# Patient Record
Sex: Male | Born: 1970 | Race: White | Hispanic: No | Marital: Single
Health system: Midwestern US, Community
[De-identification: ages and names within clinical notes are randomized; demographics above are authoritative.]

## PROBLEM LIST (undated history)

## (undated) DIAGNOSIS — F209 Schizophrenia, unspecified: Secondary | ICD-10-CM

## (undated) HISTORY — PX: NO PAST SURGERIES: SHX2092

---

## 2004-02-14 ENCOUNTER — Emergency Department (HOSPITAL_COMMUNITY): Admission: EM | Admit: 2004-02-14 | Discharge: 2004-02-15 | Payer: Self-pay | Admitting: Emergency Medicine

## 2005-10-07 ENCOUNTER — Emergency Department (HOSPITAL_COMMUNITY): Admission: EM | Admit: 2005-10-07 | Discharge: 2005-10-07 | Payer: Self-pay | Admitting: Emergency Medicine

## 2008-05-23 ENCOUNTER — Ambulatory Visit: Payer: Self-pay | Admitting: Internal Medicine

## 2008-05-23 ENCOUNTER — Inpatient Hospital Stay (HOSPITAL_COMMUNITY): Admission: EM | Admit: 2008-05-23 | Discharge: 2008-05-25 | Payer: Self-pay | Admitting: Emergency Medicine

## 2008-06-10 ENCOUNTER — Encounter: Payer: Self-pay | Admitting: Internal Medicine

## 2008-06-10 ENCOUNTER — Ambulatory Visit: Payer: Self-pay | Admitting: Infectious Diseases

## 2008-06-10 DIAGNOSIS — J069 Acute upper respiratory infection, unspecified: Secondary | ICD-10-CM | POA: Insufficient documentation

## 2008-06-10 LAB — CONVERTED CEMR LAB
Lymphocytes Relative: 37 % (ref 12–46)
Lymphs Abs: 2.1 10*3/uL (ref 0.7–4.0)
MCV: 90.9 fL (ref 78.0–100.0)
Monocytes Relative: 9 % (ref 3–12)
Neutrophils Relative %: 49 % (ref 43–77)
RBC: 4.91 M/uL (ref 4.22–5.81)
RDW: 12.5 % (ref 11.5–15.5)

## 2009-06-10 IMAGING — CR DG CHEST 2V
2 series · 2 of 2 positions shown · non-contrast
Comparison: 05/22/2008

CLINICAL DATA: Asthma

CHEST - 2 VIEW

[w chest pa]
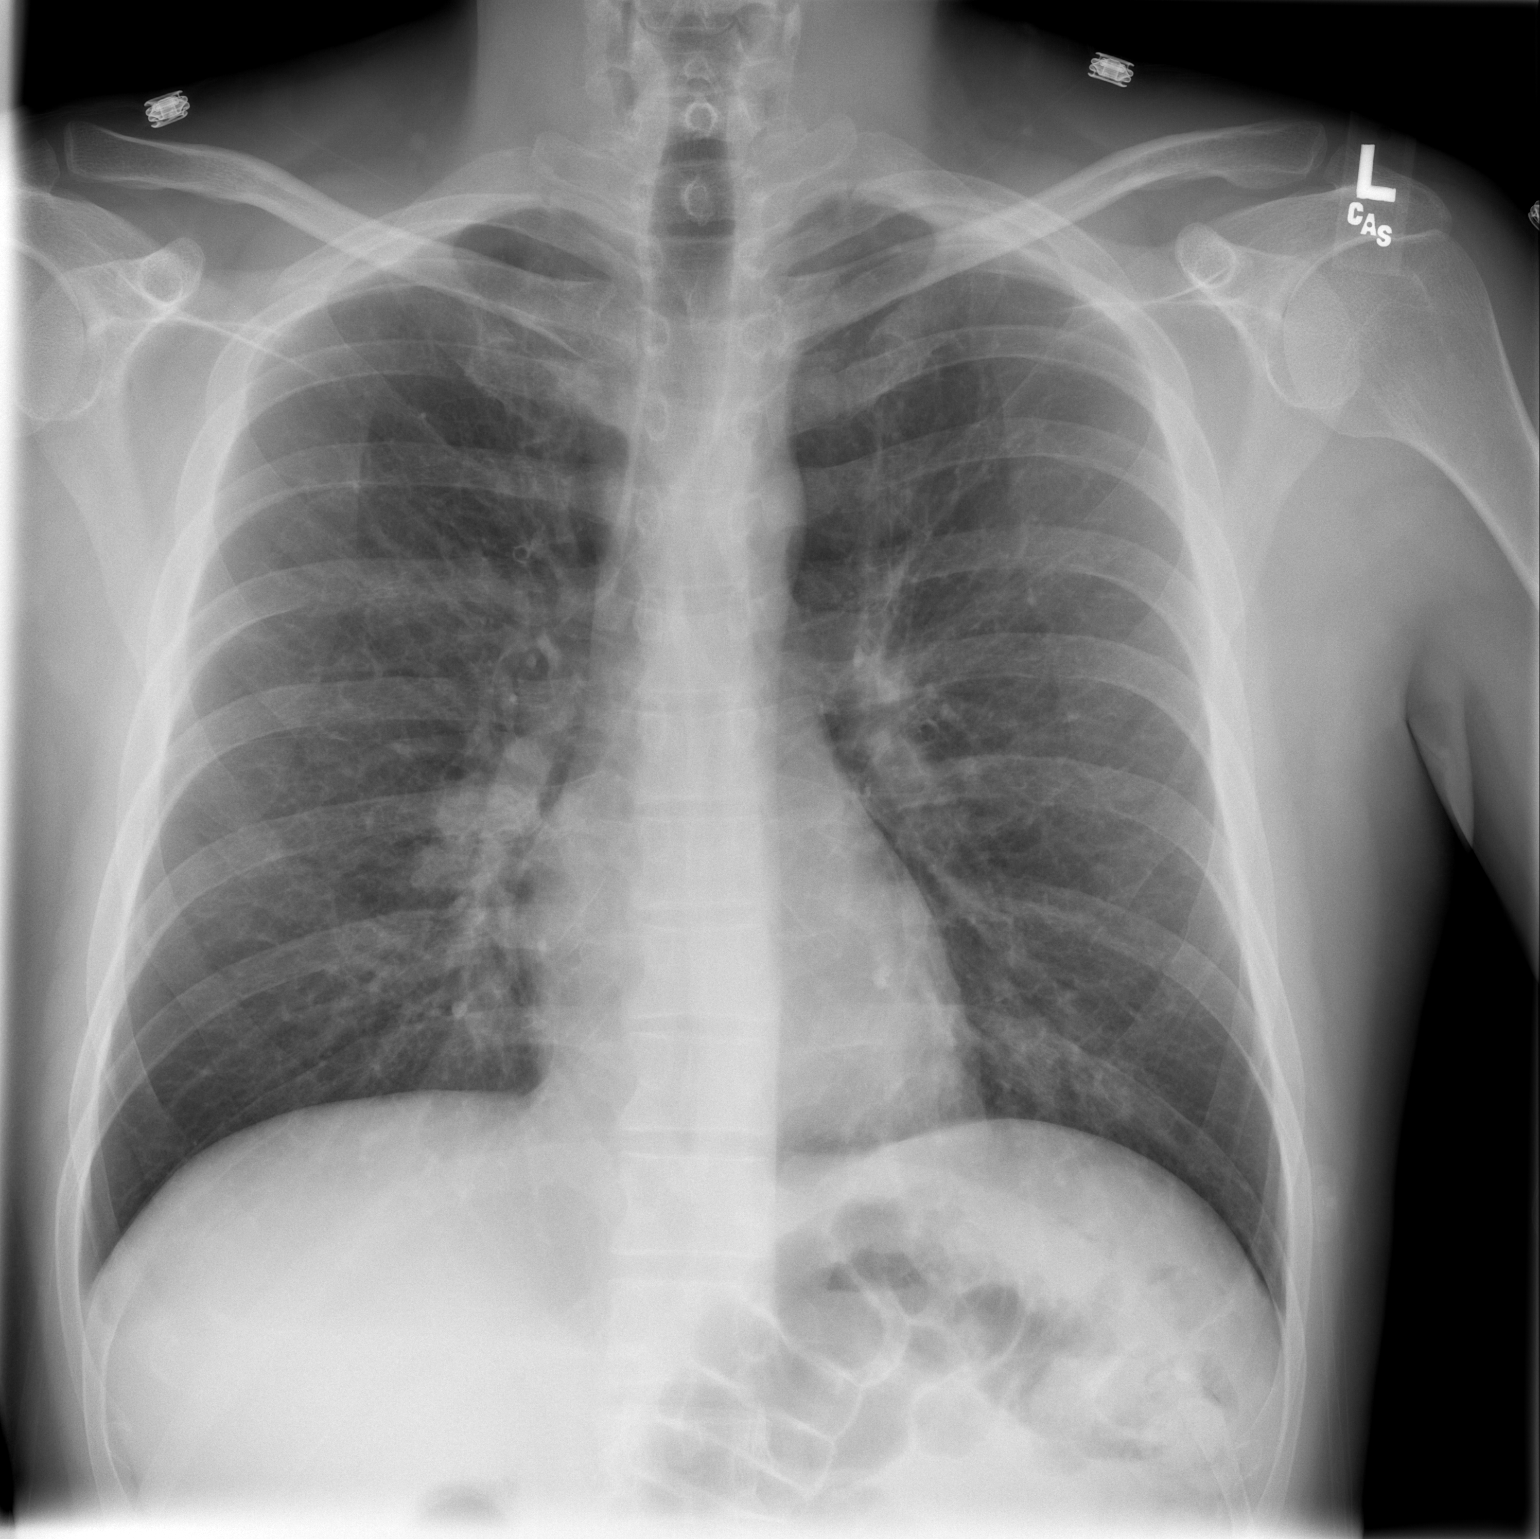

[w chest lat]
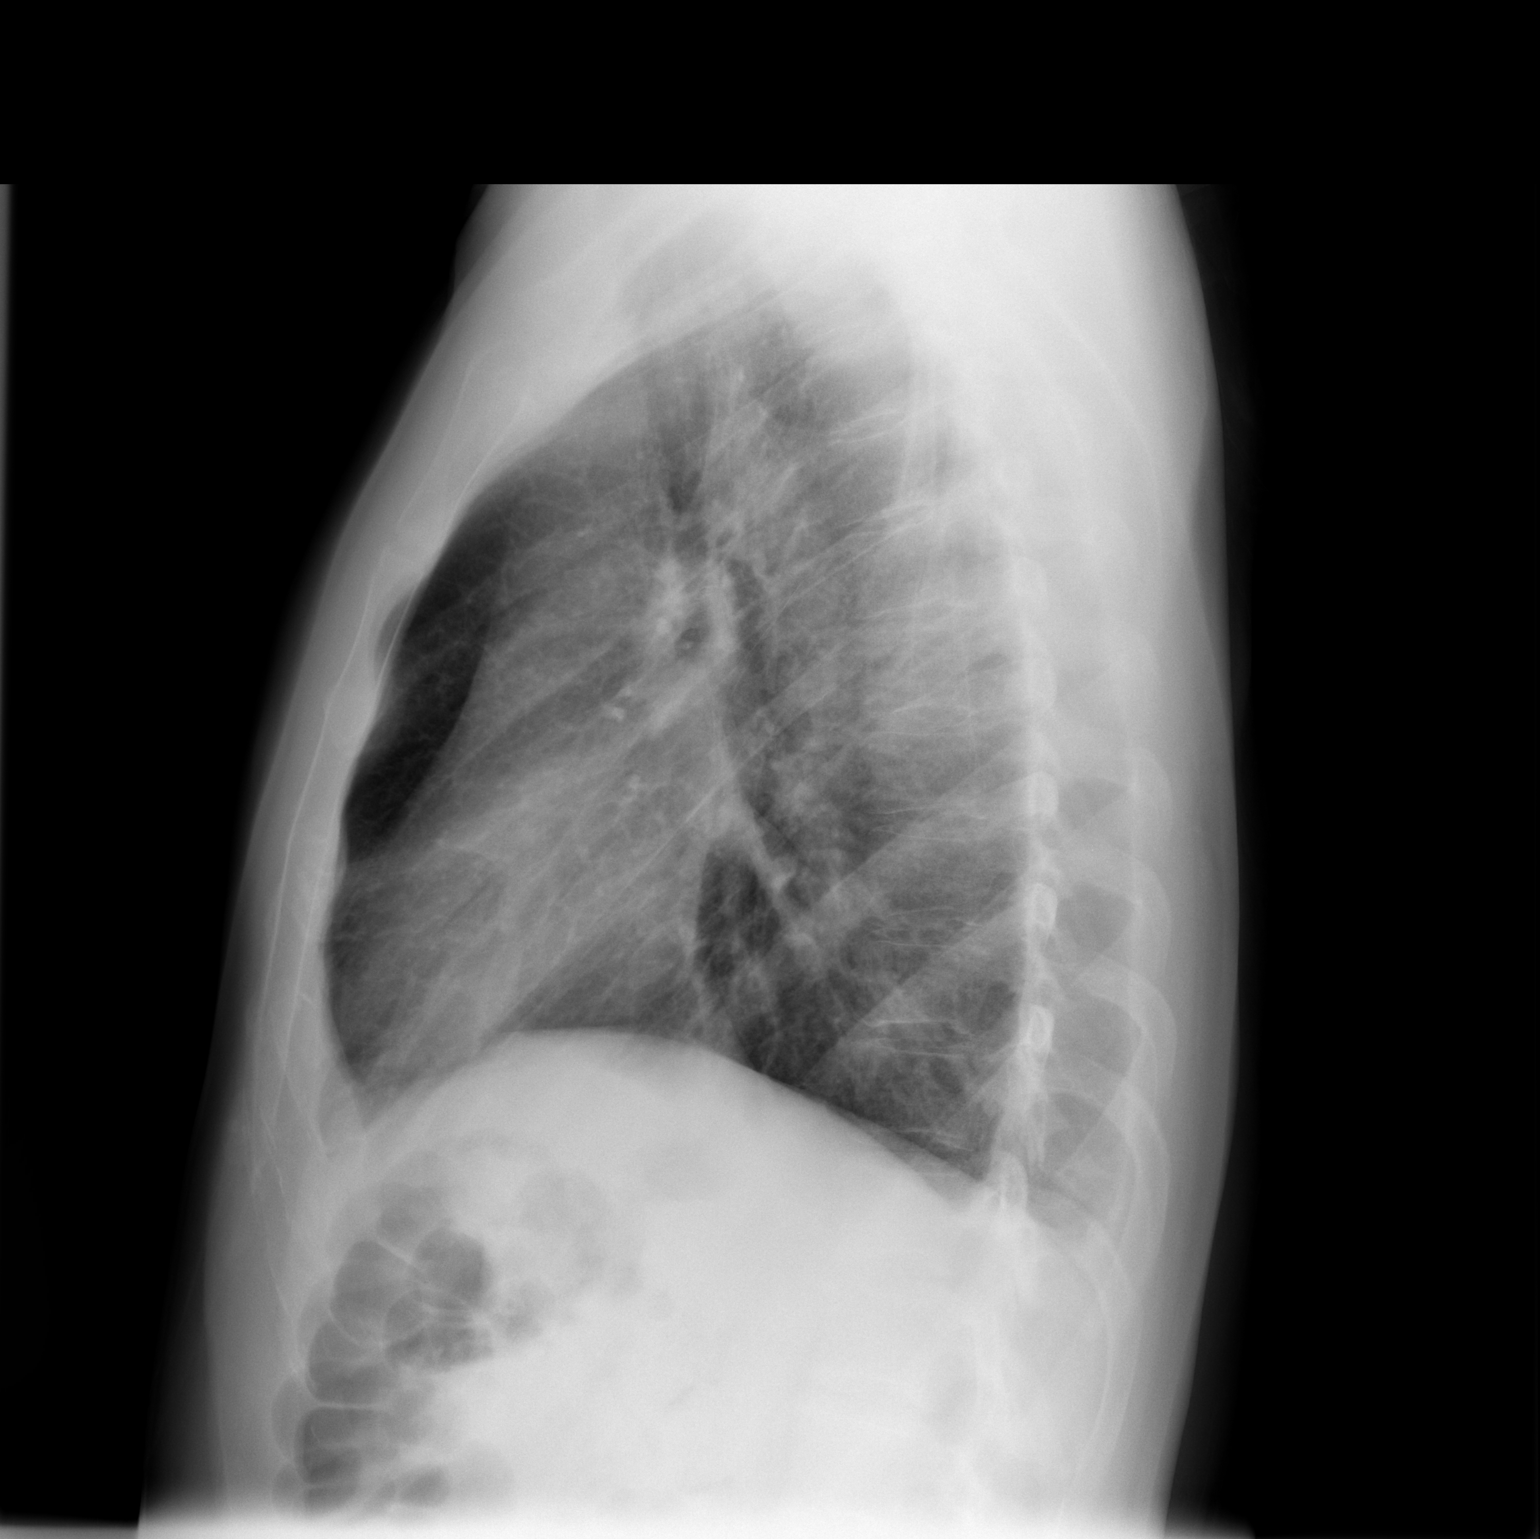

[2 of 2 positions shown; findings below may reference images not displayed]

FINDINGS: Irregular pulmonary opacities noted on the CT scan of the
chest are difficult to define.  Hazy opacities in the right upper
lobe and left base are noted.  No pneumothorax.  Small effusions
are suspected.
IMPRESSION: Subtle right upper lobe and left basilar pulmonary opacities are
noted.

## 2010-11-29 NOTE — Discharge Summary (Signed)
Jerry Frye, Jerry Frye              ACCOUNT NO.:  [id number]   MEDICAL RECORD NO.:  [id number]          PATIENT TYPE:  INP   LOCATION:  5148                         FACILITY:  [facility]   PHYSICIAN:  Alvester Morin, M.D.  DATE OF BIRTH:  Feb 28, 1971   DATE OF ADMISSION:  05/22/2008  DATE OF DISCHARGE:  05/25/2008                               DISCHARGE SUMMARY   DISCHARGE DIAGNOSES:  1. Hypoxia, cough, and shortness of breath secondary to upper      respiratory infection.  2. Bipolar disorder.  3. Tobacco abuse.   DISCHARGE MEDICATIONS:  1. Doxycycline 100 mg 1 tablet by mouth twice a day for 8 days.  2. Prednisone 30 mg 1 tablet by mouth on date May 26, 2008,      prednisone 20 mg 1 tablet by mouth on date May 27, 2008,      prednisone 10 mg 1 tablet by mouth for 1 day, on May 28, 2008,      prednisone 5 mg 1 tablet by mouth for 1 day on May 29, 2008.   The patient is instructed to take multivitamin 1 tablet by mouth daily  and also to use Mucinex 600 mg 1 tablet by mouth twice a day for 10 days  for cough control and expectoration.   The patient was instructed to drink plenty of fluid and use Tylenol for  fever, pain, and comfort, 650 mg q.6 h. p.r.n.   DISPOSITION AND FOLLOWUP:  The patient had been discharged in a stable  condition with hospital followup on June 10, 2008, at 2:30 p.m. by  Dr. Aldine Contes at the Outpatient Clinic, [facility].  It is going  to be important during this appointment to follow on the labs pending  during this admission, which were ACE level and also ANA level.  It is  going to be important also to schedule pulmonary function tests as an  outpatient in order to have a better picture of any subclinical lung  disease.  It will be also important to check a CBC in order to follow  white blood cells to vouch for complete resolution of his infection  regarding white blood cells trending down, making sure that their  elevation is  at this point secondary to the use of steroids and not so  because of infection.   PROCEDURES PERFORMED DURING THIS ADMISSION:  The patient had a chest x-  ray on May 23, 2008, that showed several right upper lobe and left  basilar pulmonary opacities, consistent, most likely, with pneumonia.  The patient also had a CT angio of the chest on May 22, 2008, that  showed bilateral interstitial pneumonitis and no pulmonary embolism.  No  other procedures were performed during this admission.   CONSULTATIONS:  No consultations were made.   BRIEF HISTORY OF PRESENT ILLNESS AT ADMISSION/VITAL SIGNS/LABORATORIES:  Mr. Winkles is a 40 year old male with past medical history of bipolar  disorder and history of being incarcerated for about 2 years.  The  patient came to the emergency department complaining of chest pain,  cough, subjective fever, and dyspnea.  The patient reports that all his  symptoms have been going on for about 1-1/2 weeks prior to admission,  but they just got worse over the last 3 days prior to being admitted.  The patient reports his chest pain is associated with deep inspiration  and presenting a productive cough without blood in it.  The patient  endorses being around people sick with flu-like symptoms.  He reports  also weakness, lightheadedness, sore throat, nausea, and fatigue.  At  the moment of admission, vital signs temperature 98.2, blood pressure  100/76, heart rate 122, respiratory rate 26, and oxygen saturation 89%  on room air.   Laboratories:  Sodium 140, potassium 4.4, bicarb 27, chloride 106, BUN  10, creatinine 1.3, and blood sugar 97.  We have white blood cells of  13.1, hemoglobin 16.6, platelets 351, ANC 11.1, and D-dimer is 0.57.  The patient had cardiac enzymes on this admission, negative x3; had a  normal urinalysis; had a rapid strep test which was negative.  We have a  rheumatoid factor less than 20.  Had an erythrocyte sedimentation rate   of 39.  Sputum culture, normal oropharyngeal flora.  Had also a monospot  screen which was negative and also an ABG that showed a pH of 7.43, PCO2  of 37.7, PO2 of 78, bicarb 25, oxygen saturation was 96%, and the  patient was wearing nasal cannula, 2 L, temperature was 98.6, and that  was done on May 22, 2008.  EKG without changes for ischemia, was  completely normal.   PENDING LABORATORY:  ANA and also ACE level.   HOSPITAL COURSE BY PROBLEM:  1. Hypoxia, shortness of breath, and cough secondary to community-      acquired pneumonia.  The patient responded well to IV antibiotics      and also the use of steroids for his wheezing.  A decision to treat      with doxycycline in order to complete 14 days was made.  He is      going also to have tapering-dose steroids and is going to be      followed as an outpatient on June 08, 2008.  Again, CT of the      chest shows findings consistent with interstitial pneumonitis.  In      order to rule out ILD, we checked for ANA and also ACE level      (results pending at dischraged) and will have as an outpatient      pulmonary function tests.  2. Bipolar disorder.  At this point not receiving any medication for      it.  The plan is to follow and address that problem as an      outpatient and, if needed, make an arrangement for psychiatric      consult and start the appropriate medication.  3. Tobacco abuse.  The patient received counseling during this      admission.  He agrees to stop smoking on his own; he had been      already working on it and had cut down to the point that he is just      smoking 1 or 2 cigarettes occasionally during social activities      while he is drinking a beer with friends.  He has been encouraged      not to smoke anymore, and we are going to re-address as an      outpatient and, if needed, will make another visit to  our social      worker Acie Fredrickson in order to make arrangement for outpatient       reinforcement in order to help him quit.   At discharge, the patient's vital signs, temperature 98.5, blood  pressure 115/67, heart rate 81, respiratory rate 18, oxygen saturation  96% on room air.  White blood cells 12.6.  The patient is on prednisone,  so this elevation is most likely secondary to steroid use.  We will  check another CBC during his followup.  Hemoglobin 15, platelets 419.  Sodium 138, potassium 4.1, chloride 104, bicarb 29, BUN 13, creatinine  0.9, and blood sugar 90.      Rosanna Randy, MD  Electronically Signed      Alvester Morin, M.D.  Electronically Signed    CEM/MEDQ  D:  05/25/2008  T:  05/25/2008  Job:  [id number]   cc:   Mliss Sax, MD

## 2010-12-25 ENCOUNTER — Emergency Department (HOSPITAL_COMMUNITY)
Admission: EM | Admit: 2010-12-25 | Discharge: 2010-12-25 | Disposition: A | Discharge status: Home / Self Care | Payer: Medicaid Other | Attending: Emergency Medicine | Admitting: Emergency Medicine

## 2010-12-25 DIAGNOSIS — T63001A Toxic effect of unspecified snake venom, accidental (unintentional), initial encounter: Secondary | ICD-10-CM | POA: Insufficient documentation

## 2010-12-25 DIAGNOSIS — T6391XA Toxic effect of contact with unspecified venomous animal, accidental (unintentional), initial encounter: Secondary | ICD-10-CM | POA: Insufficient documentation

## 2010-12-25 DIAGNOSIS — M7989 Other specified soft tissue disorders: Secondary | ICD-10-CM | POA: Insufficient documentation

## 2011-04-18 LAB — CBC
HCT: 41.7
HCT: 44.2
HCT: 48.6
Hemoglobin: 14.1
Hemoglobin: 14.9
MCHC: 34.2
MCHC: 34.7
MCV: 93
MCV: 93.4
Platelets: 366
Platelets: 389
Platelets: 419 — ABNORMAL HIGH
RBC: 4.72
RDW: 12.7
RDW: 13.1
RDW: 13.2
WBC: 11.6 — ABNORMAL HIGH
WBC: 16.4 — ABNORMAL HIGH

## 2011-04-18 LAB — POCT I-STAT, CHEM 8
Calcium, Ion: 1.1 — ABNORMAL LOW
Chloride: 106
Creatinine, Ser: 1.3
HCT: 49

## 2011-04-18 LAB — DIFFERENTIAL
Basophils Absolute: 0
Basophils Relative: 0
Basophils Relative: 1
Eosinophils Absolute: 0
Eosinophils Absolute: 0.2
Eosinophils Relative: 0
Lymphocytes Relative: 8 — ABNORMAL LOW
Lymphs Abs: 1.1
Lymphs Abs: 2.2
Monocytes Absolute: 0.2
Monocytes Absolute: 1
Monocytes Relative: 2 — ABNORMAL LOW
Neutro Abs: 10.6 — ABNORMAL HIGH
Neutro Abs: 11.1 — ABNORMAL HIGH
Neutrophils Relative %: 85 — ABNORMAL HIGH

## 2011-04-18 LAB — COMPREHENSIVE METABOLIC PANEL
Albumin: 3.5
Alkaline Phosphatase: 51
CO2: 23
Calcium: 8.8
GFR calc non Af Amer: >60
Total Protein: 7.4

## 2011-04-18 LAB — URINALYSIS, ROUTINE W REFLEX MICROSCOPIC
Bilirubin Urine: NEGATIVE
Glucose, UA: NEGATIVE
Hgb urine dipstick: NEGATIVE
Hgb urine dipstick: NEGATIVE
Ketones, ur: 40 — AB
Ketones, ur: NEGATIVE
Protein, ur: NEGATIVE
Specific Gravity, Urine: 1.035 — ABNORMAL HIGH
Urobilinogen, UA: 1
Urobilinogen, UA: 1
pH: 5.5
pH: 6.5

## 2011-04-18 LAB — BASIC METABOLIC PANEL
BUN: 10
BUN: 13
BUN: 9
CO2: 24
CO2: 29
Calcium: 8.5
Calcium: 8.8
Chloride: 109
Creatinine, Ser: 0.98
GFR calc Af Amer: >60
Glucose, Bld: 118 — ABNORMAL HIGH
Potassium: 4.4

## 2011-04-18 LAB — POCT I-STAT 3, ART BLOOD GAS (G3+)
Acid-Base Excess: 1
Bicarbonate: 24.9 — ABNORMAL HIGH
O2 Saturation: 96
TCO2: 26
pCO2 arterial: 37.7

## 2011-04-18 LAB — STREP A DNA PROBE

## 2011-04-18 LAB — CULTURE, BLOOD (ROUTINE X 2): Culture: NO GROWTH

## 2011-04-18 LAB — RAPID STREP SCREEN (MED CTR MEBANE ONLY): Streptococcus, Group A Screen (Direct): NEGATIVE

## 2011-04-18 LAB — CULTURE, RESPIRATORY W GRAM STAIN

## 2013-04-22 ENCOUNTER — Encounter (HOSPITAL_COMMUNITY): Payer: Self-pay | Admitting: Emergency Medicine

## 2013-04-22 ENCOUNTER — Emergency Department (HOSPITAL_COMMUNITY)
Admission: EM | Admit: 2013-04-22 | Discharge: 2013-04-25 | Disposition: A | Discharge status: Other Acute Inpatient Hospital | Payer: Medicaid Other | Attending: Emergency Medicine | Admitting: Emergency Medicine

## 2013-04-22 DIAGNOSIS — F259 Schizoaffective disorder, unspecified: Secondary | ICD-10-CM

## 2013-04-22 DIAGNOSIS — R51 Headache: Secondary | ICD-10-CM | POA: Insufficient documentation

## 2013-04-22 DIAGNOSIS — Z79899 Other long term (current) drug therapy: Secondary | ICD-10-CM | POA: Insufficient documentation

## 2013-04-22 DIAGNOSIS — F250 Schizoaffective disorder, bipolar type: Secondary | ICD-10-CM | POA: Diagnosis present

## 2013-04-22 DIAGNOSIS — Z8659 Personal history of other mental and behavioral disorders: Secondary | ICD-10-CM

## 2013-04-22 DIAGNOSIS — F411 Generalized anxiety disorder: Secondary | ICD-10-CM | POA: Insufficient documentation

## 2013-04-22 DIAGNOSIS — F1210 Cannabis abuse, uncomplicated: Secondary | ICD-10-CM | POA: Insufficient documentation

## 2013-04-22 DIAGNOSIS — F209 Schizophrenia, unspecified: Secondary | ICD-10-CM | POA: Insufficient documentation

## 2013-04-22 DIAGNOSIS — F22 Delusional disorders: Secondary | ICD-10-CM | POA: Insufficient documentation

## 2013-04-22 DIAGNOSIS — Z880 Allergy status to penicillin: Secondary | ICD-10-CM | POA: Insufficient documentation

## 2013-04-22 HISTORY — DX: Schizophrenia, unspecified: F20.9

## 2013-04-22 LAB — COMPREHENSIVE METABOLIC PANEL
AST: 27 U/L (ref 0–37)
Albumin: 3.9 g/dL (ref 3.5–5.2)
Alkaline Phosphatase: 50 U/L (ref 39–117)
Chloride: 102 mEq/L (ref 96–112)
Potassium: 3.2 mEq/L — ABNORMAL LOW (ref 3.5–5.1)
Sodium: 140 mEq/L (ref 135–145)
Total Bilirubin: 0.6 mg/dL (ref 0.3–1.2)
Total Protein: 7.1 g/dL (ref 6.0–8.3)

## 2013-04-22 LAB — RAPID URINE DRUG SCREEN, HOSP PERFORMED
Amphetamines: NOT DETECTED
Barbiturates: NOT DETECTED
Benzodiazepines: NOT DETECTED
Cocaine: NOT DETECTED

## 2013-04-22 LAB — CBC
HCT: 42.6 % (ref 39.0–52.0)
Hemoglobin: 14.6 g/dL (ref 13.0–17.0)
MCH: 31.7 pg (ref 26.0–34.0)
MCHC: 34.3 g/dL (ref 30.0–36.0)
MCV: 92.6 fL (ref 78.0–100.0)
Platelets: 317 K/uL (ref 150–400)
RBC: 4.6 MIL/uL (ref 4.22–5.81)
RDW: 12.6 % (ref 11.5–15.5)
WBC: 5.2 K/uL (ref 4.0–10.5)

## 2013-04-22 LAB — URINALYSIS, ROUTINE W REFLEX MICROSCOPIC
Glucose, UA: NEGATIVE mg/dL
Ketones, ur: 15 mg/dL — AB
Leukocytes, UA: NEGATIVE
Nitrite: NEGATIVE
Protein, ur: 30 mg/dL — AB
pH: 6 (ref 5.0–8.0)

## 2013-04-22 LAB — URINE MICROSCOPIC-ADD ON

## 2013-04-22 LAB — ACETAMINOPHEN LEVEL: Acetaminophen (Tylenol), Serum: <15 ug/mL (ref 10–30)

## 2013-04-22 MED ORDER — IBUPROFEN 200 MG PO TABS
600.0000 mg | ORAL_TABLET | Freq: Once | ORAL | Status: AC
  Administered 2013-04-22: 600 mg via ORAL
  Filled 2013-04-22: qty 3

## 2013-04-22 MED ORDER — ALUM & MAG HYDROXIDE-SIMETH 200-200-20 MG/5ML PO SUSP: 30.0000 mL | ORAL | Status: DC | PRN

## 2013-04-22 MED ORDER — ONDANSETRON HCL 4 MG PO TABS: 4.0000 mg | ORAL_TABLET | Freq: Three times a day (TID) | ORAL | Status: DC | PRN

## 2013-04-22 MED ORDER — IBUPROFEN 200 MG PO TABS: 600.0000 mg | ORAL_TABLET | Freq: Three times a day (TID) | ORAL | Status: DC | PRN

## 2013-04-22 MED ORDER — NICOTINE 21 MG/24HR TD PT24
21.0000 mg | MEDICATED_PATCH | Freq: Every day | TRANSDERMAL | Status: DC
  Filled 2013-04-22: qty 1

## 2013-04-22 MED ORDER — POTASSIUM CHLORIDE CRYS ER 20 MEQ PO TBCR
40.0000 meq | EXTENDED_RELEASE_TABLET | Freq: Once | ORAL | Status: AC
  Administered 2013-04-22: 40 meq via ORAL
  Filled 2013-04-22: qty 2

## 2013-04-22 MED ORDER — LORAZEPAM 1 MG PO TABS
1.0000 mg | ORAL_TABLET | Freq: Three times a day (TID) | ORAL | Status: DC | PRN
  Administered 2013-04-24: 1 mg via ORAL
  Filled 2013-04-22: qty 1

## 2013-04-22 MED ORDER — ACETAMINOPHEN 325 MG PO TABS: 650.0000 mg | ORAL_TABLET | ORAL | Status: DC | PRN
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
## 2013-04-22 NOTE — Progress Notes (Signed)
Patient confirms he does not have a pcp but he does have Medicaid insurance.  [facility] provided patient with a list of pcps who accept Medicaid insurance.  This information was placed on the front of his chart.  [facility] made aware.

## 2013-04-22 NOTE — ED Provider Notes (Signed)
CSN: [id number]     Arrival date & time 04/22/13  1325 History  This chart was scribed for non-physician practitioner working with Shon Baton, MD by Ashley Jacobs, ED scribe. This patient was seen in room [facility]3/[facility]3 and the patient's care was started at 2:05 PM.   First MD Initiated Contact with Patient 04/22/13 1351     Chief Complaint  Patient presents with  . Medical Clearance   (Consider location/radiation/quality/duration/timing/severity/associated sxs/prior Treatment) HPI HPI Comments: Jerry Frye is a 42 y.o. schizophrenic male who presents to the Emergency Department for medical clearance after being off of his medications for five weeks. Pt admits wandering and hitchhiking to multiple states for the past month. He state that he is experiencing a worsening headaches that radiates from his mid posterior to frontal region. Per sister pt was admitted to a mental facility in [place], [place] for 38 days and upon release he returned to his apartment in [place], [place]. Soon after his release he stopped taking his medications and began to have hallucinations of someone chasing him in which he would often flee his apartment. Pt's sister also mentions that he is a having sleep disturbances. Pt reports that he is sleeping 4-5 hours a night and he slept 6 hours last night. When asked about prior mental medical conditions pt denies having any. Pt report having a hx of HTN. Pt is allergic to Ciprofloxacin and Penicillins.  Past Medical History  Diagnosis Date  . Schizophrenic disorder    No past surgical history on file. No family history on file. History  Substance Use Topics  . Smoking status: Not on file  . Smokeless tobacco: Not on file  . Alcohol Use: Not on file    Review of Systems  Constitutional: Negative for fever.  HENT: Negative for sore throat and rhinorrhea.   Eyes: Negative for redness.  Respiratory: Negative for cough.   Cardiovascular: Negative for chest  pain.  Gastrointestinal: Negative for nausea, vomiting, abdominal pain and diarrhea.  Genitourinary: Negative for dysuria.  Musculoskeletal: Negative for myalgias.  Skin: Negative for rash.  Neurological: Positive for headaches.  Psychiatric/Behavioral: Positive for behavioral problems. Negative for suicidal ideas, sleep disturbance and self-injury. The patient is nervous/anxious.     Allergies  Ciprofloxacin and Penicillins  Home Medications   Current Outpatient Rx  Name  Route  Sig  Dispense  Refill  . clonazePAM (KLONOPIN) 1 MG tablet   Oral   Take 1 mg by mouth at bedtime as needed for anxiety.         . divalproex (DEPAKOTE) 250 MG DR tablet   Oral   Take 250 mg by mouth daily.         . divalproex (DEPAKOTE) 500 MG DR tablet   Oral   Take 500 mg by mouth daily.         . ziprasidone (GEODON) 80 MG capsule   Oral   Take 80 mg by mouth 2 (two) times daily with a meal.          BP 112/78  Pulse 99  Temp(Src) 97.3 F (36.3 C) (Oral)  Resp 22  SpO2 100% Physical Exam  Nursing note and vitals reviewed. Constitutional: He appears well-developed and well-nourished.  HENT:  Head: Normocephalic and atraumatic.  Eyes: Conjunctivae are normal. Right eye exhibits no discharge. Left eye exhibits no discharge.  Neck: Normal range of motion. Neck supple.  Cardiovascular: Normal rate, regular rhythm and normal heart sounds.   Pulmonary/Chest: Effort  normal and breath sounds normal.  Abdominal: Soft. There is no tenderness.  Neurological: He is alert.  Skin: Skin is warm and dry.  Psychiatric: His affect is inappropriate. He is hyperactive. Thought content is delusional.    ED Course  Procedures (including critical care time) DIAGNOSTIC STUDIES: Oxygen Saturation is 100% on room air, normal by my interpretation.    COORDINATION OF CARE: 2:10 PM Discussed course of care with pt which includes laboratory tests . Pt understands and agrees.  Labs Review Labs  Reviewed  COMPREHENSIVE METABOLIC PANEL - Abnormal; Notable for the following:    Potassium 3.2 (*)    Glucose, Bld 64 (*)    All other components within normal limits  SALICYLATE LEVEL - Abnormal; Notable for the following:    Salicylate Lvl <2.0 (*)    All other components within normal limits  URINE RAPID DRUG SCREEN (HOSP PERFORMED) - Abnormal; Notable for the following:    Tetrahydrocannabinol POSITIVE (*)    All other components within normal limits  URINALYSIS, ROUTINE W REFLEX MICROSCOPIC - Abnormal; Notable for the following:    Color, Urine AMBER (*)    APPearance CLOUDY (*)    Specific Gravity, Urine 1.034 (*)    Hgb urine dipstick SMALL (*)    Bilirubin Urine MODERATE (*)    Ketones, ur 15 (*)    Protein, ur 30 (*)    Urobilinogen, UA 2.0 (*)    All other components within normal limits  ACETAMINOPHEN LEVEL  CBC  ETHANOL  URINE MICROSCOPIC-ADD ON   Imaging Review No results found.  Vital signs reviewed and are as follows: Filed Vitals:   04/22/13 1344  BP: 112/78  Pulse: 99  Temp: 97.3 F (36.3 C)  Resp: 22   Pt will need inpatient psychiatric stabilization. Pending medical clearance labs.   3:30 PM labs reviewed. Potassium ordered for hypokalemia. Patient is otherwise medically cleared.   MDM   1. Delusions   2. History of schizophrenia    Pending psych eval. Patient is medically cleared.   I personally performed the services described in this documentation, which was scribed in my presence. The recorded information has been reviewed and is accurate.     Renne Crigler, PA-C 04/22/13 1531

## 2013-04-22 NOTE — ED Notes (Signed)
Pt states that he has not been on his medications for the past month, pt denies any SI/HI  States that his head hurts and he has not been eating or drinking. Pt said that he has been walking [place] from [place], to [place], to other states just to walk. Pt is calm at this time and cooperative.
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
## 2013-04-22 NOTE — ED Provider Notes (Signed)
Medical screening examination/treatment/procedure(s) were performed by non-physician practitioner and as supervising physician I was immediately available for consultation/collaboration.  Dafney Farler F Shaqueta Casady, MD 04/22/13 1756 

## 2013-04-23 ENCOUNTER — Encounter (HOSPITAL_COMMUNITY): Payer: Self-pay | Admitting: Registered Nurse

## 2013-04-23 DIAGNOSIS — F29 Unspecified psychosis not due to a substance or known physiological condition: Secondary | ICD-10-CM

## 2013-04-23 DIAGNOSIS — F250 Schizoaffective disorder, bipolar type: Secondary | ICD-10-CM | POA: Diagnosis present

## 2013-04-23 LAB — VALPROIC ACID LEVEL: Valproic Acid Lvl: <10 ug/mL — ABNORMAL LOW (ref 50.0–100.0)

## 2013-04-23 MED ORDER — DIVALPROEX SODIUM 250 MG PO DR TAB
250.0000 mg | DELAYED_RELEASE_TABLET | Freq: Every morning | ORAL | Status: DC
  Administered 2013-04-24 – 2013-04-25 (×2): 250 mg via ORAL
  Filled 2013-04-23 (×2): qty 1

## 2013-04-23 MED ORDER — DIVALPROEX SODIUM 500 MG PO DR TAB
500.0000 mg | DELAYED_RELEASE_TABLET | Freq: Every day | ORAL | Status: DC
  Administered 2013-04-23 – 2013-04-24 (×2): 500 mg via ORAL
  Filled 2013-04-23 (×2): qty 1
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
## 2013-04-24 NOTE — ED Notes (Signed)
Shuvon NP and Dr Taylor into see 

## 2013-04-24 NOTE — ED Notes (Signed)
Pt's sister Engineer, drilling) called-release signed by pt- Sister reports that the pt was admitted to a hospital in [place] in July [facility]) and was supossed to be transferred to a long term psych hospital, but was dc'd instead.  She reports that they went to [facility] for unknown medication injection, but they did not have any of his paper work.  She reports that his is not able to live on his own and is non-compliant w/ his medications normally.  She also reports that he can be aggressive if he feels threathened.

## 2013-04-24 NOTE — ED Notes (Signed)
Up to the bathroom 
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
## 2013-04-25 ENCOUNTER — Inpatient Hospital Stay (HOSPITAL_COMMUNITY)
Admission: AD | Admit: 2013-04-25 | Discharge: 2013-05-02 | DRG: 885 | Payer: Medicaid Other | Source: Intra-hospital | Attending: Psychiatry | Admitting: Psychiatry

## 2013-04-25 ENCOUNTER — Encounter (HOSPITAL_COMMUNITY): Payer: Self-pay | Admitting: *Deleted

## 2013-04-25 DIAGNOSIS — F259 Schizoaffective disorder, unspecified: Principal | ICD-10-CM | POA: Diagnosis present

## 2013-04-25 DIAGNOSIS — Z79899 Other long term (current) drug therapy: Secondary | ICD-10-CM

## 2013-04-25 DIAGNOSIS — Z5987 Material hardship due to limited financial resources, not elsewhere classified: Secondary | ICD-10-CM

## 2013-04-25 DIAGNOSIS — F1210 Cannabis abuse, uncomplicated: Secondary | ICD-10-CM

## 2013-04-25 DIAGNOSIS — F22 Delusional disorders: Secondary | ICD-10-CM

## 2013-04-25 DIAGNOSIS — F250 Schizoaffective disorder, bipolar type: Secondary | ICD-10-CM | POA: Diagnosis present

## 2013-04-25 DIAGNOSIS — Z598 Other problems related to housing and economic circumstances: Secondary | ICD-10-CM

## 2013-04-25 MED ORDER — NICOTINE 21 MG/24HR TD PT24
21.0000 mg | MEDICATED_PATCH | Freq: Every day | TRANSDERMAL | Status: DC
  Filled 2013-04-25 (×2): qty 1

## 2013-04-25 MED ORDER — ACETAMINOPHEN 325 MG PO TABS
650.0000 mg | ORAL_TABLET | Freq: Four times a day (QID) | ORAL | Status: DC | PRN
Start: 2013-04-25 — End: 2013-05-02

## 2013-04-25 MED ORDER — MAGNESIUM HYDROXIDE 400 MG/5ML PO SUSP: 30.0000 mL | Freq: Every day | ORAL | Status: DC | PRN

## 2013-04-25 MED ORDER — ACETAMINOPHEN 325 MG PO TABS: 650.0000 mg | ORAL_TABLET | Freq: Four times a day (QID) | ORAL | Status: DC | PRN

## 2013-04-25 MED ORDER — DIVALPROEX SODIUM 250 MG PO DR TAB
250.0000 mg | DELAYED_RELEASE_TABLET | Freq: Every morning | ORAL | Status: DC
  Administered 2013-04-26 – 2013-04-29 (×4): 250 mg via ORAL
  Filled 2013-04-25 (×5): qty 1

## 2013-04-25 MED ORDER — DIVALPROEX SODIUM 500 MG PO DR TAB
500.0000 mg | DELAYED_RELEASE_TABLET | Freq: Every day | ORAL | Status: DC
  Administered 2013-04-25 – 2013-04-28 (×4): 500 mg via ORAL
  Filled 2013-04-25 (×6): qty 1

## 2013-04-25 MED ORDER — ALUM & MAG HYDROXIDE-SIMETH 200-200-20 MG/5ML PO SUSP: 30.0000 mL | ORAL | Status: DC | PRN

## 2013-04-25 MED ORDER — LORAZEPAM 1 MG PO TABS: 1.0000 mg | ORAL_TABLET | Freq: Three times a day (TID) | ORAL | Status: DC | PRN

## 2013-04-25 NOTE — ED Notes (Signed)
Called report to [facility], spoke with [facility] RN

## 2013-04-25 NOTE — ED Notes (Signed)
Pt. Transported to [facility] by [facility] by [id number] RN., pt. Escorted to [employer] transport by BHT, pt.'s 1 bag of belongings taken to [facility] by [employer].

## 2013-04-25 NOTE — Consult Note (Signed)
  Psychiatric Specialty Exam: Physical Exam  ROS  Blood pressure 95/63, pulse 66, temperature 97.6 F (36.4 C), temperature source Oral, resp. rate 18, SpO2 98.00%.There is no weight on file to calculate BMI.  General Appearance: Casual  Eye Contact::  Good  Speech:  Clear and Coherent and Normal Rate  Volume:  Normal  Mood:  Euthymic  Affect:  Appropriate  Thought Process:  Coherent  Orientation:  Full (Time, Place, and Person)  Thought Content:  denies any hallucinations  Suicidal Thoughts:  No  Homicidal Thoughts:  No  Memory:  Immediate;   Good Recent;   Good Remote;   Good  Judgement:  Good  Insight:  Lacking  Psychomotor Activity:  Normal  Concentration:  Good  Recall:  Good  Akathisia:  Negative  Handed:  Right  AIMS (if indicated):     Assets:  Communication Skills  Sleep:   good   Mr Sar remains the same.  Here he is very calm, pleasant, cooperative.  A bit eccentric but not overtly psychotic.  Apparently his family see a different side of him than we do here.  He says all he wants is a place to stay.  Continue waiting for first available bed.

## 2013-04-25 NOTE — Consult Note (Signed)
Note reviewed and agreed with  

## 2013-04-25 NOTE — Tx Team (Signed)
Initial Interdisciplinary Treatment Plan  PATIENT STRENGTHS: (choose at least two) Ability for insight Communication skills General fund of knowledge Physical Health  PATIENT STRESSORS: Marital or family conflict Medication change or noncompliance   PROBLEM LIST: Problem List/Patient Goals Date to be addressed Date deferred Reason deferred Estimated date of resolution  Medication non-compliance 04/25/13   D/c        Homeless 04/1013   D/c                                       DISCHARGE CRITERIA:  Ability to meet basic life and health needs Improved stabilization in mood, thinking, and/or behavior Safe-care adequate arrangements made Verbal commitment to aftercare and medication compliance  PRELIMINARY DISCHARGE PLAN: Attend aftercare/continuing care group Outpatient therapy Placement in alternative living arrangements  PATIENT/FAMIILY INVOLVEMENT: This treatment plan has been presented to and reviewed with the patient, Jerry Frye, and/or family member, *  The patient and family have been given the opportunity to ask questions and make suggestions.  Cresenciano Lick 04/25/2013, 5:05 PM

## 2013-04-25 NOTE — Progress Notes (Signed)
Underwriter continue to find bed placement outside [facility].  The following hospitals were faxed referrals with bed availability: 1)[facility] 2)[facility] 3)[facility] 4)[facility] 5)[facility] 6)[facility] 7)[facility]   Blain Pais, MHT/NS

## 2013-04-25 NOTE — Progress Notes (Signed)
Pt accepted to [facility] [id number] by Dr. Ladona Ridgel to Dr. Jannifer Franklin. Patient to be transported voluntarily under [employer]. CSW completed support paperwork.   Catha Gosselin, LCSW [phone]  ED CSW .04/25/2013 1412pm

## 2013-04-25 NOTE — ED Notes (Signed)
Pt was sleeping will retrieve vitals at a later time

## 2013-04-25 NOTE — Progress Notes (Addendum)
1) [facility]: 1040 Spoke with [name] No Acute beds at this time. 2) [facility]: [phone] Left message on answering machine to return call regarding referral. 3) [facility]: 0955 Spoke with [name] no receipt of fax requested to refax 4) [facility]: 1000 Spoke with [name] no beds 5) [facility]: 1002 Spoke with [name] pt denied by Dr Alyson Reedy due to aggression recommend [facility] Term [facility] 6) [facility]: 7) [facility]:  Jerry Frye, MHT

## 2013-04-25 NOTE — ED Notes (Signed)
[facility] arrived to transport pt to [facility].  Belongings sent with pt.

## 2013-04-25 NOTE — ED Notes (Signed)
Called [employer] and notified them to transport pt to [facility].  Informed that pt would be picked up for transport within the hour.

## 2013-04-26 DIAGNOSIS — F200 Paranoid schizophrenia: Secondary | ICD-10-CM

## 2013-04-26 MED ORDER — ZIPRASIDONE HCL 80 MG PO CAPS
80.0000 mg | ORAL_CAPSULE | Freq: Every day | ORAL | Status: DC
  Administered 2013-04-26 – 2013-04-29 (×4): 80 mg via ORAL
  Filled 2013-04-26 (×6): qty 1

## 2013-04-26 MED ORDER — TRAZODONE HCL 50 MG PO TABS
50.0000 mg | ORAL_TABLET | Freq: Every evening | ORAL | Status: DC | PRN
  Administered 2013-04-28 – 2013-04-30 (×2): 50 mg via ORAL
  Filled 2013-04-26: qty 1
  Filled 2013-04-26: qty 3
  Filled 2013-04-26 (×2): qty 1
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
## 2013-04-26 NOTE — Progress Notes (Signed)
Pt visible in the milieu. In the dayroom conversing with peers much of the night. Affect and mood both euphoric. Pt continues with bizarre behaviors. Bowing to this Clinical research associate. Presents with hands folded out, palms up. No complaints. Medicated per orders. New order for geodon given per MD and education provided. Denies SI/HI/AVH and remains safe. Lawrence Marseilles

## 2013-04-26 NOTE — BHH Suicide Risk Assessment (Signed)
Suicide Risk Assessment  Admission Assessment     Nursing information obtained from:  Patient Demographic factors:  Male;Low socioeconomic status Current Mental Status:    Loss Factors:  Decrease in vocational status;Financial problems / change in socioeconomic status Historical Factors:  Family history of suicide Risk Reduction Factors:     CLINICAL FACTORS:   Depression:   Anhedonia Schizophrenia:   Depressive state Paranoid or undifferentiated type  COGNITIVE FEATURES THAT CONTRIBUTE TO RISK:  Closed-mindedness Polarized thinking    SUICIDE RISK:   Moderate:  Frequent suicidal ideation with limited intensity, and duration, some specificity in terms of plans, no associated intent, good self-control, limited dysphoria/symptomatology, some risk factors present, and identifiable protective factors, including available and accessible social support.  PLAN OF CARE:  I certify that inpatient services furnished can reasonably be expected to improve the patient's condition.  Burnell Hurta 04/26/2013, 10:49 AM

## 2013-04-26 NOTE — Progress Notes (Signed)
The focus of this group is to help patients review their daily goal of treatment and discuss progress on daily workbooks. Pt attended the evening group session and responded to discussion prompts from the Writer. Pt reported having had a good day on the unit, the highlight of which Pt said was sharing his goal with everyone, which was to move into an apartment upon discharge. "I don't know how I'm going to do that yet, but I'm glad I'm sharing it with people." Pt's only additional need from Nursing Staff this evening was to shave, which he was allowed to do following group. Pt's affect was excited and his gestures were very exaggerated.

## 2013-04-26 NOTE — Progress Notes (Addendum)
Patient ID: Jerry Frye, male   DOB: 04-29-1971, 42 y.o.   MRN: [id number] Psychoeducational Group Note  Date:  04/26/2013 Time:0900am  Group Topic/Focus:  Identifying Needs:   The focus of this group is to help patients identify their personal needs that have been historically problematic and identify healthy behaviors to address their needs.  Participation Level:  Active  Participation Quality:  Appropriate  Affect:  Excited  Cognitive:  racing thoughts   Insight:  Supportive  Engagement in Group:  Supportive  Additional Comments:  inventory group   Valente David 04/26/2013,9:29 AM

## 2013-04-26 NOTE — Progress Notes (Signed)
Patient ID: BRYSIN TOWERY, male   DOB: 09/18/1970, 42 y.o.   MRN: [id number] 04-26-13 nursing shift note: D: pt has been very euphoric, energetic and talkative. He is taking his medication and came to group. He denies any si/hi. A: staff has been supportive and available for this patient.  Staff has continuously encourage him throughout the shift.  He has not requested any prn's thus far on my shift. R: on his inventory sheet he wrote: slept well, appetite good, energy normal, attention good and he did not address his depression and his hopelessness. He has had no pain or physical problems in the last 24 hrs. After discharge he plans to "take very good care of myself. I just need help getting an apt or home", he stated. RN will monitor and Q 15 min ck's continue.

## 2013-04-27 LAB — BASIC METABOLIC PANEL
BUN: 10 mg/dL (ref 6–23)
CO2: 29 mEq/L (ref 19–32)
Creatinine, Ser: 0.84 mg/dL (ref 0.50–1.35)
GFR calc Af Amer: >90 mL/min (ref >90)
GFR calc non Af Amer: >90 mL/min (ref >90)
Potassium: 3.7 mEq/L (ref 3.5–5.1)

## 2013-04-27 NOTE — BHH Group Notes (Signed)
BHH Group Notes:  (Clinical Social Work)  04/27/2013   11:15am-12:00pm  Summary of Progress/Problems:  The main focus of today's process group was to listen to a variety of genres of music and to identify that different types of music provoke different responses.  The patient then was able to identify personally what was soothing for them, as well as energizing.  Handouts were used to record feelings evoked, as well as how patient can personally use this knowledge in sleep habits, with depression, and with other symptoms.  The patient expressed understanding of concepts, as well as knowledge of how each type of music affected them and how this can be used when they are at home as a tool in their recovery.  Type of Therapy:  Music Therapy   Participation Level:  Active  Participation Quality:  Attentive and Sharing  Affect:  Excited  Cognitive:  Oriented  Insight:  Engaged  Engagement in Therapy:  Engaged  Modes of Intervention:   Activity, Exploration  Ambrose Mantle, LCSW 04/27/2013, 12:46 PM

## 2013-04-27 NOTE — BHH Group Notes (Signed)
BHH Group Notes:  (Nursing/MHT/Case Management/Adjunct)  Date:  04/27/2013  Time:  11:46 AM  Type of Therapy:  Psychoeducational Skills  Participation Level:  Did Not Attend    Jerry Frye 04/27/2013, 11:46 AM 

## 2013-04-27 NOTE — Progress Notes (Signed)
Patient ID: Jerry Frye, male   DOB: Aug 18, 1970, 42 y.o.   MRN: [id number] Psychoeducational Group Note  Date:  04/27/2013 Time:  0930am  Group Topic/Focus:  Making Healthy Choices:   The focus of this group is to help patients identify negative/unhealthy choices they were using prior to admission and identify positive/healthier coping strategies to replace them upon discharge.  Participation Level:  Active  Participation Quality:  Appropriate  Affect:  Excited  Cognitive:  Lacking  Insight:  Supportive  Engagement in Group:  Supportive  Additional Comments:  Inventory group   Valente David 04/27/2013,10:15 AM

## 2013-04-27 NOTE — Progress Notes (Signed)
Pt continues to state he is doing well. He is appreciative for the admission and now realizes it was needed as is his ongoing med compliance. Pt accepts that his housing is uncertain but states that no matter what hardships he may encounter, he plans to stay on his meds. Affect and mood remain euphoric. Also continues with bizarre gesturing but states this is his way of notifying others that he is pleasant and not angry or feeling aggressive. Pt supported, encouraged and praised for insight. Medicated per orders without difficulty. Denies SI/HI/AVH and remains safe. Lawrence Marseilles

## 2013-04-27 NOTE — Progress Notes (Signed)
The focus of this group is to help patients review their daily goal of treatment and discuss progress on daily workbooks. Pt attended the evening group session and responded to discussion prompts from the Writer. Pt reported having a generally good day on the unit. Pt shared that Jerry Frye planned to take better care of himself upon discharge by unconditionally taking his medications as prescribed ("no matter what!") and by finding a stable living environment. Pt's only additional need from Nursing Staff this evening was to shave, which Jerry Frye was allowed to do following group. Pt's affect was bright.

## 2013-04-27 NOTE — Progress Notes (Signed)
[facility] MD Progress Note  04/27/2013 11:50 AM Jerry Frye  MRN:  [id number] Subjective:   Patient states "I slept great! Everything is good and I'm just waiting to leave. I need to get an apartment so I can have a home."  Objective:  Patient continues to exhibit a euphoric mood. Staff note that the patient continues with bizarre behaviors such as bowing when at first starting to talk to someone. Patient is very pleasant but appears to have manic symptoms.   Diagnosis:   DSM5:  Axis I: Schizoaffective Disorder, bipolar type Axis II: Deferred Axis III:  Past Medical History  Diagnosis Date  . Schizophrenic disorder    Axis IV: economic problems, housing problems, occupational problems, other psychosocial or environmental problems and problems with primary support group Axis V: 41-50 serious symptoms  ADL's:  Intact  Sleep: Good  Appetite:  Good  Suicidal Ideation:  Denies Homicidal Ideation:  Denies AEB (as evidenced by):  Psychiatric Specialty Exam: Review of Systems  Constitutional: Negative.   HENT: Negative.   Eyes: Negative.   Respiratory: Negative.   Cardiovascular: Negative.   Gastrointestinal: Negative.   Genitourinary: Negative.   Musculoskeletal: Negative.   Skin: Negative.   Neurological: Negative.   Endo/Heme/Allergies: Negative.   Psychiatric/Behavioral: Negative for depression, suicidal ideas, hallucinations, memory loss and substance abuse. The patient is nervous/anxious. The patient does not have insomnia.     Blood pressure 92/61, pulse 94, temperature 98.2 F (36.8 C), temperature source Oral, resp. rate 16, height 5\' 10"  (1.778 m), weight 76.204 kg (168 lb), SpO2 98.00%.Body mass index is 24.11 kg/(m^2).  General Appearance: Disheveled  Eye Contact::  Good  Speech:  Pressured  Volume:  Normal  Mood:  Euphoric  Affect:  Bright   Thought Process:  Goal Directed  Orientation:  Full (Time, Place, and Person)  Thought Content:  Rumination   Suicidal Thoughts:  No  Homicidal Thoughts:  No  Memory:  Immediate;   Fair Recent;   Fair Remote;   Fair  Judgement:  Impaired  Insight:  Lacking  Psychomotor Activity:  Increased  Concentration:  Fair  Recall:  Fair  Akathisia:  No  Handed:  Right  AIMS (if indicated):     Assets:  Communication Skills Desire for Improvement Intimacy Leisure Time Physical Health Resilience Social Support  Sleep:  Number of Hours: 6.25   Current Medications: Current Facility-Administered Medications  Medication Dose Route Frequency Provider Last Rate Last Dose  . acetaminophen (TYLENOL) tablet 650 mg  650 mg Oral Q6H PRN Shuvon Rankin, NP      . alum & mag hydroxide-simeth (MAALOX/MYLANTA) 200-200-20 MG/5ML suspension 30 mL  30 mL Oral Q4H PRN Shuvon Rankin, NP      . divalproex (DEPAKOTE) DR tablet 250 mg  250 mg Oral q morning - 10a Shuvon Rankin, NP   250 mg at 04/27/13 0740  . divalproex (DEPAKOTE) DR tablet 500 mg  500 mg Oral QHS Shuvon Rankin, NP   500 mg at 04/26/13 2133  . LORazepam (ATIVAN) tablet 1 mg  1 mg Oral Q8H PRN Shuvon Rankin, NP      . magnesium hydroxide (MILK OF MAGNESIA) suspension 30 mL  30 mL Oral Daily PRN Benjaman Pott, MD      . traZODone (DESYREL) tablet 50 mg  50 mg Oral QHS PRN Fransisca Kaufmann, NP      . ziprasidone (GEODON) capsule 80 mg  80 mg Oral QHS Thresa Ross, MD   80 mg at  04/26/13 2231    Lab Results:  Results for orders placed during the hospital encounter of 04/25/13 (from the past 48 hour(s))  BASIC METABOLIC PANEL     Status: Abnormal   Collection Time    04/27/13  7:06 AM      Result Value Range   Sodium 140  135 - 145 mEq/L   Potassium 3.7  3.5 - 5.1 mEq/L   Chloride 104  96 - 112 mEq/L   CO2 29  19 - 32 mEq/L   Glucose, Bld 112 (*) 70 - 99 mg/dL   BUN 10  6 - 23 mg/dL   Creatinine, Ser 1.61  0.50 - 1.35 mg/dL   Calcium 9.7  8.4 - 09.6 mg/dL   GFR calc non Af Amer >90  >90 mL/min   GFR calc Af Amer >90  >90 mL/min   Comment: (NOTE)      The eGFR has been calculated using the CKD EPI equation.     This calculation has not been validated in all clinical situations.     eGFR's persistently <90 mL/min signify possible Chronic Kidney     Disease.     Performed at [facility]    Physical Findings: AIMS: Facial and Oral Movements Muscles of Facial Expression: None, normal Lips and Perioral Area: None, normal Jaw: None, normal Tongue: None, normal,Extremity Movements Upper (arms, wrists, hands, fingers): None, normal Lower (legs, knees, ankles, toes): None, normal, Trunk Movements Neck, shoulders, hips: None, normal, Overall Severity Severity of abnormal movements (highest score from questions above): None, normal Incapacitation due to abnormal movements: None, normal Patient's awareness of abnormal movements (rate only patient's report): No Awareness, Dental Status Current problems with teeth and/or dentures?: No Does patient usually wear dentures?: No  CIWA:    COWS:     Treatment Plan Summary: Daily contact with patient to assess and evaluate symptoms and progress in treatment Medication management  Plan: Continue crisis management and stabilization.  Medication management: Continue Depakote DR 250 mg daily in morning and 500 mg at hs for improved stability of mood. Continue Geodon 80 mg daily at bedtime for psychosis.  Encouraged patient to attend groups and participate in group counseling sessions and activities.  Discharge plan in progress.  Continue current treatment plan.  Address health issues: Vitals reviewed and stable.   Medical Decision Making Problem Points:  Established problem, stable/improving (1) and Review of psycho-social stressors (1) Data Points:  Review of medication regiment & side effects (2)  I certify that inpatient services furnished can reasonably be expected to improve the patient's condition.   DAVIS, LAURA NP-C 04/27/2013, 11:50 AM I agreed with findings and  treatment plan of this patient

## 2013-04-27 NOTE — Progress Notes (Signed)
Patient ID: Jerry Frye, male   DOB: 06/10/1971, 42 y.o.   MRN: [id number] 04-27-13 @ 1547 nursing shift note: D: pt was started on geodon last night. He continues to be bizarre, but is cooperative and polite. He is taking his medications with no adverse effects, coming to groups and interacting in the milieu. He denies any si/hi/av. A: RN asked him if he was ok around the lunch hour he stated he was mildly dizzy and RN asked him to get in the bed and RN raised the 2 of his bed rails. R: the dizziness subsided. He had no complaints of pain. On his inventory sheet he wrote: slept well, appetite good, energy normal and his attention was good. He did not address his depression or hopelessness. All other points on his inventory sheet were left blank.  RN will monitor and Q 15 min ck's continue.

## 2013-04-27 NOTE — Progress Notes (Signed)
Pt observed in dayroom. Participated in group conversation before bed. Patient was animated. Appeared to have some internal stimuli.  Patient states he slept well. Patient plans to attend groups today.   Patient safety maintained on unit, Q71min checks.

## 2013-04-28 NOTE — [facility] Group Notes (Signed)
[facility] LCSW Aftercare Discharge Planning Group Note   04/28/2013 8:10 AM  Participation Quality:  Engaged  Mood/Affect:  Excited  Depression Rating:  denies  Anxiety Rating:  denies  Thoughts of Suicide:  No Will you contract for safety?   NA  Current AVH:  No  Plan for Discharge/Comments:  States he left his sister's house and went for a long walk-all the way to [place] and then to [place] and back.  Could name all the roads he had been on, and admitted that he did get rides, so he did not walk the whole way.  Gets disability and MCD.  Wants to live in an apartment.  Told him I could get him a list of affordable housing in [place].  He was appreciative of that.   Transportation Means:  sister  Supports: sister  Ida Rogue

## 2013-04-28 NOTE — Progress Notes (Signed)
Patient ID: Jerry Frye, male   DOB: November 01, 1970, 42 y.o.   MRN: [id number] Pt. In room, reports "I was living in a motel, off my medication, my sister brought me here"  Pt. Bowed to Clinical research associate after she spoke to him. A: Writer introduced self to client and encouraged group. Staff will monitor q19min for safety. R: Pt. Is safe on the unit. Pt. Attends group.

## 2013-04-28 NOTE — Progress Notes (Signed)
Recreation Therapy Notes  Date: 10.13.2014 Time: 9:30am Location: [address] Dayroom  Group Topic: Leisure Education  Goal Area(s) Addresses:  Patient will verbalize activity of interest by end of group session. Patient will verbalize the ability to use positive leisure/recreation as a coping mechanism.  Behavioral Response: Engaged, Appropriate  Intervention: Game  Activity: Patients were asked to select a letter of the alphabet, state an activity that starts with that letter and then identify an emotion they experience when participating in selected activity.   Education:  Leisure Education, Pharmacologist, Building control surveyor.   Education Outcome: Acknowledges understanding  Clinical Observations/Feedback: Patient actively participated in activity, stating appropriate activities, as well as emotions. Patient offered suggestions to peer as needed.   Marykay Lex Daja Shuping, LRT/CTRS  Veronica Fretz L 04/28/2013 4:04 PM

## 2013-04-28 NOTE — Tx Team (Signed)
  Interdisciplinary Treatment Plan Update   Date Reviewed:  04/28/2013  Time Reviewed:  8:10 AM  Progress in Treatment:   Attending groups: Yes Participating in groups: Yes Taking medication as prescribed: Yes  Tolerating medication: Yes Family/Significant other contact made: No Patient understands diagnosis: Somewhat  States he needs help "getting back on my meds." Discussing patient identified problems/goals with staff: Yes  See initial care plan Medical problems stabilized or resolved: Yes Denies suicidal/homicidal ideation: Yes  In tx team Patient has not harmed self or others: Yes  For review of initial/current patient goals, please see plan of care.  Estimated Length of Stay:  4-5 days  Reason for Continuation of Hospitalization: Mania Medication stabilization  New Problems/Goals identified:  N/A  Discharge Plan or Barriers:   return home, follow up outpt  Additional Comments:  This is a 42yo Caucasian male who was hospitalized due to mania and bizarre behaviors. He left [place] on August 29th and walked through a number of other states before returning to [place] on October 6th. During that time he was without medications. He was taken to a hospital in [facility] but states they said he was "fine." He has SSI and Medicaid, and his sister is his representative payee. He wants to get into an apartment rather than moving from one motel to another, or living in boarding homes   Attendees:  Signature: Thedore Mins, MD 04/28/2013 8:10 AM   Signature: Richelle Ito, LCSW 04/28/2013 8:10 AM  Signature: Fransisca Kaufmann, NP 04/28/2013 8:10 AM  Signature: Joslyn Devon, RN 04/28/2013 8:10 AM  Signature: Liborio Nixon, RN 04/28/2013 8:10 AM  Signature:  04/28/2013 8:10 AM  Signature:   04/28/2013 8:10 AM  Signature:    Signature:    Signature:    Signature:    Signature:    Signature:      Scribe for Treatment Team:   Richelle Ito, LCSW  04/28/2013 8:10 AM

## 2013-04-28 NOTE — Progress Notes (Signed)
Patient ID: Jerry Frye, male   DOB: September 30, 1970, 42 y.o.   MRN: [id number] D:Patient presents with bright, euphoric mood this morning.  He talked of his walking travels to [place] and [place].  He listed all the highways that he traveled.  Patient still has ritualistic behaviors such as turning his med cup over and tapping the window sill twice.  He is interacting well.  His speech is rapid and pressured.  He is negative for SI/HI/AVH.  Patient is compliant with his medication.  He wrote on his self inventory, "I take very good care of myself, but I will take my medicines know matter how well or bad I feel."  A:Patient has been attending groups and participating in his treatment.  Continue to monitor medication management and MD orders.  Safety checks completed every 15 minutes per protocol.  R:Patient's behavior has been appropriate.

## 2013-04-28 NOTE — [facility] Group Notes (Signed)
[facility] LCSW Group Therapy  04/28/2013 1:15 pm  Type of Therapy: Process Group Therapy  Participation Level: Did not attend    Summary of Progress/Problems: Today's group addressed the issue of overcoming obstacles.  Patients were asked to identify their biggest obstacle post d/c that stands in the way of their on-going success, and then problem solve as to how to manage this.    Daryel Gerald B 04/28/2013   5:07 PM

## 2013-04-28 NOTE — Progress Notes (Signed)
[facility] MD Progress Note  04/28/2013 2:32 PM Jerry Frye  MRN:  [id number] Subjective:  Patient stated his sleep is "good", appetite is "great".  He was in his room "trying to catch a nap."  Denies depression, suicidal/homicidal ideations and hallucinations on assessment.  Pleasant mood with congruent affect, engages easily, and answers questions appropriately on assessment. Diagnosis:   DSM5:  Axis I: Schizoaffective disorder Axis II: Deferred Axis III:  Past Medical History  Diagnosis Date  . Schizophrenic disorder    Axis IV: other psychosocial or environmental problems, problems related to social environment and problems with primary support group Axis V: 51-60 moderate symptoms  ADL's:  Intact  Sleep: Good  Appetite:  Good  Suicidal Ideation:  Denies Homicidal Ideation:  Denies Psychiatric Specialty Exam: Review of Systems  Constitutional: Negative.   HENT: Negative.   Eyes: Negative.   Respiratory: Negative.   Cardiovascular: Negative.   Gastrointestinal: Negative.   Genitourinary: Negative.   Musculoskeletal: Negative.   Skin: Negative.   Neurological: Negative.   Endo/Heme/Allergies: Negative.   Psychiatric/Behavioral: The patient is nervous/anxious.     Blood pressure 94/60, pulse 97, temperature 98 F (36.7 C), temperature source Oral, resp. rate 18, height 5\' 10"  (1.778 m), weight 168 lb (76.204 kg), SpO2 98.00%.Body mass index is 24.11 kg/(m^2).  General Appearance: Casual  Eye Contact::  Fair  Speech:  Normal Rate  Volume:  Normal  Mood:  Euthymic  Affect:  Congruent  Thought Process:  Goal Directed  Orientation:  Full (Time, Place, and Person)  Thought Content:  WDL  Suicidal Thoughts:  No  Homicidal Thoughts:  No  Memory:  Immediate;   Fair Recent;   Fair Remote;   Fair  Judgement:  Impaired  Insight:  Lacking  Psychomotor Activity:  Decreased  Concentration:  Fair  Recall:  Fair  Akathisia:  No  Handed:  Right  AIMS (if indicated):      Assets:  Physical Health Resilience  Sleep:  Number of Hours: 6.5   Current Medications: Current Facility-Administered Medications  Medication Dose Route Frequency Provider Last Rate Last Dose  . acetaminophen (TYLENOL) tablet 650 mg  650 mg Oral Q6H PRN Shuvon Rankin, NP      . alum & mag hydroxide-simeth (MAALOX/MYLANTA) 200-200-20 MG/5ML suspension 30 mL  30 mL Oral Q4H PRN Shuvon Rankin, NP      . divalproex (DEPAKOTE) DR tablet 250 mg  250 mg Oral q morning - 10a Shuvon Rankin, NP   250 mg at 04/28/13 0754  . divalproex (DEPAKOTE) DR tablet 500 mg  500 mg Oral QHS Shuvon Rankin, NP   500 mg at 04/27/13 2147  . LORazepam (ATIVAN) tablet 1 mg  1 mg Oral Q8H PRN Shuvon Rankin, NP      . magnesium hydroxide (MILK OF MAGNESIA) suspension 30 mL  30 mL Oral Daily PRN Benjaman Pott, MD      . traZODone (DESYREL) tablet 50 mg  50 mg Oral QHS PRN Fransisca Kaufmann, NP      . ziprasidone (GEODON) capsule 80 mg  80 mg Oral QHS Thresa Ross, MD   80 mg at 04/27/13 2147    Lab Results:  Results for orders placed during the hospital encounter of 04/25/13 (from the past 48 hour(s))  BASIC METABOLIC PANEL     Status: Abnormal   Collection Time    04/27/13  7:06 AM      Result Value Range   Sodium 140  135 - 145 mEq/L  Potassium 3.7  3.5 - 5.1 mEq/L   Chloride 104  96 - 112 mEq/L   CO2 29  19 - 32 mEq/L   Glucose, Bld 112 (*) 70 - 99 mg/dL   BUN 10  6 - 23 mg/dL   Creatinine, Ser 4.09  0.50 - 1.35 mg/dL   Calcium 9.7  8.4 - 81.1 mg/dL   GFR calc non Af Amer >90  >90 mL/min   GFR calc Af Amer >90  >90 mL/min   Comment: (NOTE)     The eGFR has been calculated using the CKD EPI equation.     This calculation has not been validated in all clinical situations.     eGFR's persistently <90 mL/min signify possible Chronic Kidney     Disease.     Performed at [facility]    Physical Findings: AIMS: Facial and Oral Movements Muscles of Facial Expression: None, normal Lips and  Perioral Area: None, normal Jaw: None, normal Tongue: None, normal,Extremity Movements Upper (arms, wrists, hands, fingers): None, normal Lower (legs, knees, ankles, toes): None, normal, Trunk Movements Neck, shoulders, hips: None, normal, Overall Severity Severity of abnormal movements (highest score from questions above): None, normal Incapacitation due to abnormal movements: None, normal Patient's awareness of abnormal movements (rate only patient's report): No Awareness, Dental Status Current problems with teeth and/or dentures?: No Does patient usually wear dentures?: No  CIWA:    COWS:     Treatment Plan Summary: Daily contact with patient to assess and evaluate symptoms and progress in treatment Medication management  Plan:  Review of chart, vital signs, medications, and notes. 1-Individual and group therapy 2-Medication management for depression and anxiety:  Medications reviewed with the patient and he stated no untoward effects, no changes made 3-Coping skills for chronic mental illness 4-Continue crisis stabilization and management 5-Address health issues--monitoring vital signs, stable 6-Treatment plan in progress to prevent relapse of psychosis  Medical Decision Making Problem Points:  Established problem, stable/improving (1) and Review of psycho-social stressors (1) Data Points:  Review of medication regiment & side effects (2)  I certify that inpatient services furnished can reasonably be expected to improve the patient's condition.   Nanine Means, PMH-NP 04/28/2013, 2:32 PM

## 2013-04-28 NOTE — Progress Notes (Signed)
Adult Psychoeducational Group Note  Date:  04/28/2013 Time:  11:00AM Group Topic/Focus:  Wellness Toolbox:   The focus of this group is to discuss various aspects of wellness, balancing those aspects and exploring ways to increase the ability to experience wellness.  Patients will create a wellness toolbox for use upon discharge.  Participation Level:  Active  Participation Quality:  Appropriate and Attentive  Affect:  Appropriate  Cognitive:  Alert and Appropriate  Insight: Appropriate  Engagement in Group:  Engaged  Modes of Intervention:  Discussion  Additional Comments:  Pt. Was attentive and appropriate during today's group discussion. Pt. Was able to complete self care assessment. Pt shared that he need to improve on make time away from telephones, email, and the Internet. Pt shard that he use to journal and can feel the difference since he have stopped witting.    Bing Plume D 04/28/2013, 1:24 PM

## 2013-04-29 DIAGNOSIS — F259 Schizoaffective disorder, unspecified: Principal | ICD-10-CM

## 2013-04-29 DIAGNOSIS — F1210 Cannabis abuse, uncomplicated: Secondary | ICD-10-CM

## 2013-04-29 MED ORDER — DIVALPROEX SODIUM 500 MG PO DR TAB
500.0000 mg | DELAYED_RELEASE_TABLET | Freq: Two times a day (BID) | ORAL | Status: DC
  Administered 2013-04-29 – 2013-05-02 (×6): 500 mg via ORAL
  Filled 2013-04-29: qty 1
  Filled 2013-04-29: qty 6
  Filled 2013-04-29: qty 1
  Filled 2013-04-29: qty 6
  Filled 2013-04-29 (×2): qty 1
  Filled 2013-04-29: qty 6
  Filled 2013-04-29 (×5): qty 1
  Filled 2013-04-29: qty 6

## 2013-04-29 NOTE — Progress Notes (Signed)
Patient ID: Jerry Frye, male   DOB: Jul 24, 1970, 42 y.o.   MRN: [id number] [facility] MD Progress Note  04/29/2013 10:25 AM Jerry Frye  MRN:  [id number] Subjective:"I am happy to be back on my medications, I did not take them for 5 weeks."   Objective:  Patient continues to exhibit flight of ideas, euphoric mood and hyper talkativeness. He says that his medications have been helping stabilizing his mood and sleeping better. His behavior remains bizarre and odd. He is compliant with his medications and has not endorsed any adverse reactions.  Diagnosis:   DSM5:  Axis I: Schizoaffective Disorder, bipolar type Axis II: Deferred Axis III:  Past Medical History  Diagnosis Date   Axis IV: economic problems, housing problems, occupational problems, other psychosocial or environmental problems and problems with primary support group Axis V: 41-50 serious symptoms  ADL's:  Intact  Sleep: Good  Appetite:  Good  Suicidal Ideation:  Denies Homicidal Ideation:  Denies AEB (as evidenced by):  Psychiatric Specialty Exam: Review of Systems  Constitutional: Negative.   HENT: Negative.   Eyes: Negative.   Respiratory: Negative.   Cardiovascular: Negative.   Gastrointestinal: Negative.   Genitourinary: Negative.   Musculoskeletal: Negative.   Skin: Negative.   Neurological: Negative.   Endo/Heme/Allergies: Negative.   Psychiatric/Behavioral: Negative for depression, suicidal ideas, hallucinations, memory loss and substance abuse. The patient is nervous/anxious. The patient does not have insomnia.     Blood pressure 91/62, pulse 101, temperature 98.2 F (36.8 C), temperature source Oral, resp. rate 18, height 5\' 10"  (1.778 m), weight 76.204 kg (168 lb), SpO2 98.00%.Body mass index is 24.11 kg/(m^2).  General Appearance: fairly groomed  Patent attorney::  Good  Speech:  Pressured  Volume:  Normal  Mood:  Euphoric  Affect:  Bright   Thought Process:  Goal Directed  Orientation:  Full  (Time, Place, and Person)  Thought Content:  Rumination  Suicidal Thoughts:  No  Homicidal Thoughts:  No  Memory:  Immediate;   Fair Recent;   Fair Remote;   Fair  Judgement:  marginal  Insight:  marginal  Psychomotor Activity:  Increased  Concentration:  Fair  Recall:  Fair  Akathisia:  No  Handed:  Right  AIMS (if indicated):     Assets:  Communication Skills Desire for Improvement Intimacy Leisure Time Physical Health Resilience Social Support  Sleep:  Number of Hours: 6.5   Current Medications: Current Facility-Administered Medications  Medication Dose Route Frequency Provider Last Rate Last Dose  . acetaminophen (TYLENOL) tablet 650 mg  650 mg Oral Q6H PRN Shuvon Rankin, NP      . alum & mag hydroxide-simeth (MAALOX/MYLANTA) 200-200-20 MG/5ML suspension 30 mL  30 mL Oral Q4H PRN Shuvon Rankin, NP      . divalproex (DEPAKOTE) DR tablet 500 mg  500 mg Oral BID PC Jaquay Morneault      . LORazepam (ATIVAN) tablet 1 mg  1 mg Oral Q8H PRN Jerrilyn Messinger      . magnesium hydroxide (MILK OF MAGNESIA) suspension 30 mL  30 mL Oral Daily PRN Benjaman Pott, MD      . traZODone (DESYREL) tablet 50 mg  50 mg Oral QHS PRN Fransisca Kaufmann, NP   50 mg at 04/28/13 2215  . ziprasidone (GEODON) capsule 80 mg  80 mg Oral QHS Thresa Ross, MD   80 mg at 04/28/13 2214    Lab Results:  No results found for this or any previous visit (from the  past 48 hour(s)).  Physical Findings: AIMS: Facial and Oral Movements Muscles of Facial Expression: None, normal Lips and Perioral Area: None, normal Jaw: None, normal Tongue: None, normal,Extremity Movements Upper (arms, wrists, hands, fingers): None, normal Lower (legs, knees, ankles, toes): None, normal, Trunk Movements Neck, shoulders, hips: None, normal, Overall Severity Severity of abnormal movements (highest score from questions above): None, normal Incapacitation due to abnormal movements: None, normal Patient's awareness of abnormal  movements (rate only patient's report): No Awareness, Dental Status Current problems with teeth and/or dentures?: No Does patient usually wear dentures?: No  CIWA:    COWS:     Treatment Plan Summary: Daily contact with patient to assess and evaluate symptoms and progress in treatment Medication management  Plan: Continue crisis management and stabilization.  Medication management: Increase Depakote DR to  500 mg po BID for improved stability of mood. Continue Geodon 80 mg daily at bedtime for psychosis.  Encouraged patient to attend groups and participate in group counseling sessions and activities.  Discharge plan in progress.  Continue current treatment plan.  Address health issues: Vitals reviewed and stable.  Valproic acid level on 04/30/13 Medical Decision Making Problem Points:  Established problem, stable/improving (1) and Review of psycho-social stressors (1) Data Points:  Review of medication regiment & side effects (2)  I certify that inpatient services furnished can reasonably be expected to improve the patient's condition.   Thedore Mins, MD 04/29/2013, 10:25 AM

## 2013-04-29 NOTE — [facility] Group Notes (Signed)
[facility] LCSW Group Therapy  04/29/2013 , 4:40 PM   Type of Therapy:  Group Therapy  Participation Level:  Active  Participation Quality:  Attentive  Affect:  Appropriate  Cognitive:  Alert  Insight:  Improving  Engagement in Therapy:  Engaged  Modes of Intervention:  Discussion, Exploration and Socialization  Summary of Progress/Problems: Today's group focused on the term Diagnosis.  Participants were asked to define the term, and then pronounce whether it is a negative, positive or neutral term.  Jerry Frye talked about his diagnosis in terms of his non-compliance with medication, and how that always means he will end up in the hospital.  "i guess I just feel like I am doing fine, and don't need to take meds anymore."  He feels like he is well on his way on the recovery continuum, and happy that he is getting an opportunity to be stabilized.  Jerry Frye B 04/29/2013 , 4:40 PM

## 2013-04-29 NOTE — Progress Notes (Signed)
Patient ID: Jerry Frye, male   DOB: 1970/07/27, 42 y.o.   MRN: [id number] D:Patient has euphoric bright mood.  Patient is ritualistic with his actions.  He met with MD and is eager for discharge.  Depakote level has been ordered to evaluate before discharge.  He is motivated to continue treatment and medication after discharge.  He plans to find an apartment or live temporarily in a hotel.  He denies any SI/HI/AVH.  He is interacting well with others and attending groups.  A:Continue to monitor medication management and MD orders.  Safety checks completed every 15 minutes per protocol. R: Patient's behavior has been appropriate.

## 2013-04-29 NOTE — Progress Notes (Signed)
Patient appeared bright on approach. Mood and affect appropriate. He is very cooperative and polite. He denied SI/HI and denied hallucinations. Writer encouraged and supported patient. Q 15 minute check continues as ordered to maintain safety.

## 2013-04-29 NOTE — [facility] Group Notes (Signed)
Adult Psychoeducational Group Note  Date:  04/29/2013 Time:  8:52 PM  Group Topic/Focus:  Wrap-Up Group:   The focus of this group is to help patients review their daily goal of treatment and discuss progress on daily workbooks.  Participation Level:  Active  Participation Quality:  Appropriate  Affect:  Appropriate  Cognitive:  Appropriate  Insight: Good  Engagement in Group:  Engaged  Modes of Intervention:  Discussion  Additional Comments:  Tabitha expressed that he had a great day.  He stated that he is supposed to go home tomorrow but his living situation needs to be worked out so he will be here a few extra days.  His coping skills include reading and walking.  Caroll Rancher A 04/29/2013, 8:52 PM

## 2013-04-29 NOTE — Progress Notes (Signed)
The focus of this group is to help patients review their daily goal of treatment and discuss progress on daily workbooks. Pt attended the evening group session and responded to all discussion prompts from the Writer. Pt shared that he had a good day on the unit, one positive thing from which was that he felt his medications were working well for him. Pt also enthusiastically shared that his case manager had been helping him to find somewhere to go upon discharge. Pt's affect was appropriate.

## 2013-04-30 LAB — VALPROIC ACID LEVEL: Valproic Acid Lvl: 78.1 ug/mL (ref 50.0–100.0)

## 2013-04-30 MED ORDER — NONFORMULARY OR COMPOUNDED ITEM
1.0000 "application " | Freq: Once | Status: AC
  Administered 2013-05-02: 1 via TOPICAL
  Filled 2013-04-30: qty 1

## 2013-04-30 MED ORDER — TUBERCULIN PPD 5 UNIT/0.1ML ID SOLN
5.0000 [IU] | Freq: Once | INTRADERMAL | Status: AC
  Administered 2013-04-30: 5 [IU] via INTRADERMAL

## 2013-04-30 MED ORDER — ZIPRASIDONE HCL 60 MG PO CAPS
60.0000 mg | ORAL_CAPSULE | Freq: Two times a day (BID) | ORAL | Status: DC
  Administered 2013-04-30 – 2013-05-02 (×4): 60 mg via ORAL
  Filled 2013-04-30: qty 6
  Filled 2013-04-30 (×2): qty 1
  Filled 2013-04-30: qty 6
  Filled 2013-04-30: qty 1
  Filled 2013-04-30: qty 6
  Filled 2013-04-30 (×3): qty 1
  Filled 2013-04-30: qty 6
  Filled 2013-04-30: qty 1

## 2013-04-30 MED ORDER — BENZTROPINE MESYLATE 1 MG PO TABS
1.0000 mg | ORAL_TABLET | Freq: Two times a day (BID) | ORAL | Status: DC
  Administered 2013-04-30 – 2013-05-02 (×5): 1 mg via ORAL
  Filled 2013-04-30: qty 1
  Filled 2013-04-30: qty 6
  Filled 2013-04-30 (×2): qty 1
  Filled 2013-04-30 (×2): qty 6
  Filled 2013-04-30 (×4): qty 1
  Filled 2013-04-30: qty 6
  Filled 2013-04-30 (×2): qty 1

## 2013-04-30 NOTE — [facility] Group Notes (Signed)
[facility] LCSW Aftercare Discharge Planning Group Note   04/30/2013 9:33 AM  Participation Quality:  Engaged  Mood/Affect:  Appropriate   Depression Rating:  denies  Anxiety Rating:  denies  Thoughts of Suicide:  No Will you contract for safety?   NA  Current AVH:  No  Plan for Discharge/Comments:  CSW informed Aleksander that he has an interview today with a Hotel manager.  Will need to receive a TB skin test since it is a requirement for the assisted living facility.  Thanked CSW for his help.  Brode stated that he had nothing else to discuss.     Transportation Means:  sister  Supports: sister  Simona Huh

## 2013-04-30 NOTE — [facility] Group Notes (Signed)
[facility] [employer] Group Therapy  04/30/2013 , 1:38 PM    Type of Therapy:  [employer] Presentation  Participation Level:  Active  Participation Quality:  Attentive  Affect:  Blunted  Cognitive:  Oriented  Insight:  Limited  Engagement in Therapy:  Engaged  Modes of Intervention:  Discussion, Education and Socialization  Summary of Progress/Problems:  Onalee Hua from [employer] came to present his recovery story and play the guitar.  Jerry Frye enjoyed the group very much.  He asked several questions, and talked about how much he enjoyed the guitar playing.  Stated he felt the presenter was working out his issues on the guitar like he works out his issues by walking.  Jerry Frye 04/30/2013 , 1:38 PM

## 2013-04-30 NOTE — Progress Notes (Signed)
Adult Psychoeducational Group Note  Date:  04/30/2013 Time:  9:18 PM  Group Topic/Focus:  Wrap-Up Group:   The focus of this group is to help patients review their daily goal of treatment and discuss progress on daily workbooks.  Participation Level:  Active  Participation Quality:  Appropriate  Affect:  Excited  Cognitive:  Appropriate  Insight: Limited  Engagement in Group:  Engaged  Modes of Intervention:  Support  Additional Comments:  Patient attended and participated in group tonight. He reports having a good day. His goal is to get his own apartment. Patient advised that someone came to see him today from an assistant living facility. He plans to go into an assistant living facility upon discharge, however, his goal is to get his own apartment.   Lita Mains [facility] 04/30/2013, 9:18 PM

## 2013-04-30 NOTE — Progress Notes (Addendum)
Patient ID: Jerry Frye, male   DOB: May 21, 1971, 42 y.o.   MRN: [id number] D:Patient's demeanor is less bright today.  He remains hyperactive and hyperverbal, but less euphoric. He has been observed talking to himself. He met with representative from the state to get a [place] number.  Patient is willing to go to an assisted living facility. He received his tuberculin injection and tolerated it well. He denies any SI/HI/AVH. A:Continue to monitor medication management and MD orders.  Safety checks completed every 15 minutes per protocol.  R:Patient's behavior has been appropriate.

## 2013-04-30 NOTE — Progress Notes (Signed)
Patient ID: Jerry Frye, male   DOB: 09-Oct-1970, 42 y.o.   MRN: [id number]  [facility] MD Progress Note  04/30/2013 11:33 AM [facility]  MRN:  [id number] Subjective: Patient states "I'm doing good. Everything is positive. I have questions about assisted living. I think it would be better than living in a hotel. But I think my sister told the MD some things about me that I don't agree with. I don't have any behavior problems. There is nothing wrong with what I do. The police check on me all the time when I'm walking around.  Objective:  Patient continues to exhibit flight of ideas, euphoric mood and hyper talkativeness. He says that his medications have been helping stabilizing his mood and sleeping better. His behavior remains bizarre and odd. Nursing staff report that the patient display odd behaviors when taking his medication such as arranging the cups in a certain way before taking the medications. He also continues to gesture to staff by assuming a yoga-like position upon greeting.   Diagnosis:   DSM5:  Axis I: Schizoaffective Disorder, bipolar type Axis II: Deferred Axis III:  Past Medical History  Diagnosis Date   Axis IV: economic problems, housing problems, occupational problems, other psychosocial or environmental problems and problems with primary support group Axis V: 41-50 serious symptoms  ADL's:  Intact  Sleep: Good  Appetite:  Good  Suicidal Ideation:  Denies Homicidal Ideation:  Denies AEB (as evidenced by):  Psychiatric Specialty Exam: Review of Systems  Constitutional: Negative.   HENT: Negative.   Eyes: Negative.   Respiratory: Negative.   Cardiovascular: Negative.   Gastrointestinal: Negative.   Genitourinary: Negative.   Musculoskeletal: Negative.   Skin: Negative.   Neurological: Negative.   Endo/Heme/Allergies: Negative.   Psychiatric/Behavioral: Negative for depression, suicidal ideas, hallucinations, memory loss and substance abuse. The  patient is nervous/anxious. The patient does not have insomnia.     Blood pressure 100/68, pulse 108, temperature 97.7 F (36.5 C), temperature source Oral, resp. rate 20, height 5\' 10"  (1.778 m), weight 76.204 kg (168 lb), SpO2 98.00%.Body mass index is 24.11 kg/(m^2).  General Appearance: Fairly groomed  Patent attorney::  Good  Speech:  Pressured  Volume:  Normal  Mood:  Euphoric  Affect:  Bright   Thought Process:  Goal Directed  Orientation:  Full (Time, Place, and Person)  Thought Content:  Rumination  Suicidal Thoughts:  No  Homicidal Thoughts:  No  Memory:  Immediate;   Fair Recent;   Fair Remote;   Fair  Judgement:  marginal  Insight:  marginal  Psychomotor Activity:  Increased  Concentration:  Fair  Recall:  Fair  Akathisia:  No  Handed:  Right  AIMS (if indicated):     Assets:  Communication Skills Desire for Improvement Intimacy Leisure Time Physical Health Resilience Social Support  Sleep:  Number of Hours: 6.25   Current Medications: Current Facility-Administered Medications  Medication Dose Route Frequency Provider Last Rate Last Dose  . acetaminophen (TYLENOL) tablet 650 mg  650 mg Oral Q6H PRN Shuvon Rankin, NP      . alum & mag hydroxide-simeth (MAALOX/MYLANTA) 200-200-20 MG/5ML suspension 30 mL  30 mL Oral Q4H PRN Shuvon Rankin, NP      . benztropine (COGENTIN) tablet 1 mg  1 mg Oral BID Mojeed Akintayo   1 mg at 04/30/13 0954  . divalproex (DEPAKOTE) DR tablet 500 mg  500 mg Oral BID PC Mojeed Akintayo   500 mg at 04/30/13 0747  .  LORazepam (ATIVAN) tablet 1 mg  1 mg Oral Q8H PRN Mojeed Akintayo      . magnesium hydroxide (MILK OF MAGNESIA) suspension 30 mL  30 mL Oral Daily PRN Benjaman Pott, MD      . Melene Muller ON 05/02/2013] NONFORMULARY OR COMPOUNDED ITEM 1 application  1 application Topical Once Mojeed Akintayo      . traZODone (DESYREL) tablet 50 mg  50 mg Oral QHS PRN Fransisca Kaufmann, NP   50 mg at 04/28/13 2215  . tuberculin injection 5 Units  5 Units  Intradermal Once Fransisca Kaufmann, NP      . ziprasidone (GEODON) capsule 60 mg  60 mg Oral BID WC Mojeed Akintayo        Lab Results:  Results for orders placed during the hospital encounter of 04/25/13 (from the past 48 hour(s))  VALPROIC ACID LEVEL     Status: None   Collection Time    04/30/13  6:20 AM      Result Value Range   Valproic Acid Lvl 78.1  50.0 - 100.0 ug/mL   Comment: Performed at [facility]    Physical Findings: AIMS: Facial and Oral Movements Muscles of Facial Expression: None, normal Lips and Perioral Area: None, normal Jaw: None, normal Tongue: None, normal,Extremity Movements Upper (arms, wrists, hands, fingers): None, normal Lower (legs, knees, ankles, toes): None, normal, Trunk Movements Neck, shoulders, hips: None, normal, Overall Severity Severity of abnormal movements (highest score from questions above): None, normal Incapacitation due to abnormal movements: None, normal Patient's awareness of abnormal movements (rate only patient's report): No Awareness, Dental Status Current problems with teeth and/or dentures?: No Does patient usually wear dentures?: No  CIWA:    COWS:     Treatment Plan Summary: Daily contact with patient to assess and evaluate symptoms and progress in treatment Medication management  Plan: Continue crisis management and stabilization.  Medication management: Continue Depakote DR to 500 mg po BID for improved stability of mood. Increase Geodon to 60 mg BID for continued psychosis.  Encouraged patient to attend groups and participate in group counseling sessions and activities.  Discharge plan in progress. Case manager helping to assist patient with transition to assisted living center. PPD placed today.  Continue current treatment plan.  Address health issues: Vitals reviewed and stable.   Medical Decision Making Problem Points:  Established problem, stable/improving (1) and Review of psycho-social stressors (1) Data  Points:  Review of medication regiment & side effects (2)  I certify that inpatient services furnished can reasonably be expected to improve the patient's condition.   Fransisca Kaufmann, NP-C 04/30/2013, 11:33 AM

## 2013-04-30 NOTE — Progress Notes (Signed)
Patient in day room at the beginning of this shift. He remains euphoric and polite. Mood and affects appropriate. Patient denied SI/HI and denied hallucinations. Writer encouraged and supported patient. Patient receptive to encouragement and support. Q 15 minute check continues as ordered to maintain safety.

## 2013-04-30 NOTE — Progress Notes (Signed)
Recreation Therapy Notes  Date: 10.15.2014 Time: 9:30am Location: [address] Dayroom   Group Topic: Memory/Cognition  Goal Area(s) Addresses:  Patient will verbalize understanding of importance of working on memory/cognition. Patient will successfully repeat sequence developed by group.   Behavioral Response: Engaged, Appropraite   Intervention: Game  Activity: Memory Sequence. Patient with LRT and group members developed a sequence of actions. Patients were asked to complete the sequence created by group and when prompted add onto that sequence.   Education:  Cognition  Education Outcome: Acknowledges understanding  Clinical Observations/Feedback: LRT initially intended on patients playing group rock paper scissors. As LRT, with assistance of MHT staff explained rules of group activity it was apparent patients were unable to process instructions provided. Due to patients inability to interact in planned activity LRT changed group activity to memory sequence game. Patient stated he understood instructions and attempted to explain activity to peers, however patient was unable to do so. Patient transitioned into memory sequence without issue.   Patient actively participated in memory sequence activity, needing minimal assistance completing sequence created. Patient accepted assistance without issue. Patient was observed to laugh heartily throughout session, stating "This is funny" several times.   Marykay Lex Kenidi Elenbaas, LRT/CTRS  Jearl Klinefelter 04/30/2013 4:31 PM

## 2013-04-30 NOTE — Progress Notes (Signed)
Seen and agreed. Kyoko Elsea, MD 

## 2013-05-01 NOTE — [facility] Suicide Risk Assessment (Signed)
[facility] INPATIENT:  Family/Significant Other Suicide Prevention Education  Suicide Prevention Education:  Education Completed; No one has been identified by the patient as the family member/significant other with whom the patient will be residing, and identified as the person(s) who will aid the patient in the event of a mental health crisis (suicidal ideations/suicide attempt).  With written consent from the patient, the family member/significant other has been provided the following suicide prevention education, prior to the and/or following the discharge of the patient.  The suicide prevention education provided includes the following:  Suicide risk factors  Suicide prevention and interventions  National Suicide Hotline telephone number  [facility] assessment telephone number  [facility] Emergency Assistance 911  [facility] and/or Residential Mobile Crisis Unit telephone number  Request made of family/significant other to:  Remove weapons (e.g., guns, rifles, knives), all items previously/currently identified as safety concern.    Remove drugs/medications (over-the-counter, prescriptions, illicit drugs), all items previously/currently identified as a safety concern.  The family member/significant other verbalizes understanding of the suicide prevention education information provided.  The family member/significant other agrees to remove the items of safety concern listed above.  The patient did not endorse SI at the time of admission, nor did the patient c/o SI during the stay here.  SPE not required.   Daryel Gerald B 05/01/2013, 4:54 PM

## 2013-05-01 NOTE — Progress Notes (Signed)
D: Patient in day room at the beginning of this shift. Mood and affect bright and appropriate.He reported having a great day; denied SI/HI and denied Hallucinations. Patient stated that he would be discharged into and ALF; he said he is looking forward to that and would use if as a learning experience. Although he further stated that his long term goal is to get a place of his own and move his 3 daughters un with him. " I can't take my daughters to stay with me at the ALF?" A: Writer encouraged and supported patient. R: Patient receptive to encouragement and support. Q 15 minute check continues as ordered to maintain safety.

## 2013-05-01 NOTE — Progress Notes (Addendum)
D:  Patient's self inventory sheet, patient sleeps well, good appetite, normal energy level, good attention span.  Denied SI.  Plans to take medications after discharge.  No problems taking meds after discharge. A:  Medications administered per MD orders.  Emotional support and encouragement given patient. R:  Denied SI and HI.  Denied A/V hallucinations.  Denied pain.  Will continue to monitor patient for safety with 15 minute checks.  Safety maintained.  Patient has been sleeping after morning group.  Encouraged patient to drink fluids often today to prevent low BP.  Patient denied depression, hopelessness, anxiety.  Went back to bed after lunch to rest.

## 2013-05-01 NOTE — Tx Team (Signed)
  Interdisciplinary Treatment Plan Update   Date Reviewed:  05/01/2013  Time Reviewed:  4:39 PM  Progress in Treatment:   Attending groups: Yes Participating in groups: Yes Taking medication as prescribed: Yes  Tolerating medication: Yes Family/Significant other contact made: Yes  Patient understands diagnosis: Yes  Discussing patient identified problems/goals with staff: Yes Medical problems stabilized or resolved: Yes Denies suicidal/homicidal ideation: Yes Patient has not harmed self or others: Yes  For review of initial/current patient goals, please see plan of care.  Estimated Length of Stay:  D/C tomorrow  Reason for Continuation of Hospitalization:   New Problems/Goals identified:  N/A  Discharge Plan or Barriers:   move to group home, follow up outpt  Additional Comments:  Attendees:  Signature: Thedore Mins, MD 05/01/2013 4:39 PM   Signature: Richelle Ito, LCSW 05/01/2013 4:39 PM  Signature: Fransisca Kaufmann, NP 05/01/2013 4:39 PM  Signature: Joslyn Devon, RN 05/01/2013 4:39 PM  Signature: Liborio Nixon, RN 05/01/2013 4:39 PM  Signature:  05/01/2013 4:39 PM  Signature:   05/01/2013 4:39 PM  Signature:    Signature:    Signature:    Signature:    Signature:    Signature:      Scribe for Treatment Team:   Richelle Ito, LCSW  05/01/2013 4:39 PM

## 2013-05-01 NOTE — Progress Notes (Signed)
Patient ID: Jerry Frye, male   DOB: 08-Feb-1971, 42 y.o.   MRN: [id number] [facility] MD Progress Note  05/01/2013 11:02 AM CRESTON KLAS  MRN:  [id number] Subjective:"I think I am doing well and I am ready for discharge."   Objective:  Patient reports decreased mood swing, euphoria and racing thoughts. He says that his medications have been helping stabilizing his mood and sleeping better. He is compliant with his medications and has not endorsed any adverse reactions. Valproic acid level 78.1  Diagnosis:   DSM5:  Axis I: Schizoaffective Disorder, bipolar type Axis II: Deferred Axis III:  Past Medical History  Diagnosis Date   Axis IV: economic problems, housing problems, occupational problems, other psychosocial or environmental problems and problems with primary support group Axis V: 50-60 moderate symptoms  ADL's:  Intact  Sleep: Good  Appetite:  Good  Suicidal Ideation:  Denies Homicidal Ideation:  Denies AEB (as evidenced by):  Psychiatric Specialty Exam: Review of Systems  Constitutional: Negative.   HENT: Negative.   Eyes: Negative.   Respiratory: Negative.   Cardiovascular: Negative.   Gastrointestinal: Negative.   Genitourinary: Negative.   Musculoskeletal: Negative.   Skin: Negative.   Neurological: Negative.   Endo/Heme/Allergies: Negative.   Psychiatric/Behavioral: Negative for depression, suicidal ideas, hallucinations, memory loss and substance abuse. The patient is nervous/anxious. The patient does not have insomnia.     Blood pressure 103/69, pulse 68, temperature 97.2 F (36.2 C), temperature source Oral, resp. rate 16, height 5\' 10"  (1.778 m), weight 76.204 kg (168 lb), SpO2 98.00%.Body mass index is 24.11 kg/(m^2).  General Appearance: fairly groomed  Patent attorney::  Good  Speech:  Pressured  Volume:  Normal  Mood:  Euphoric  Affect:  Bright   Thought Process:  Goal Directed  Orientation:  Full (Time, Place, and Person)  Thought Content:   Rumination  Suicidal Thoughts:  No  Homicidal Thoughts:  No  Memory:  Immediate;   Fair Recent;   Fair Remote;   Fair  Judgement:  marginal  Insight:  marginal  Psychomotor Activity: normal  Concentration:  Fair  Recall:  Fair  Akathisia:  No  Handed:  Right  AIMS (if indicated):     Assets:  Communication Skills Desire for Improvement Intimacy Leisure Time Physical Health Resilience Social Support  Sleep:  Number of Hours: 6.5   Current Medications: Current Facility-Administered Medications  Medication Dose Route Frequency Provider Last Rate Last Dose  . acetaminophen (TYLENOL) tablet 650 mg  650 mg Oral Q6H PRN Shuvon Rankin, NP      . alum & mag hydroxide-simeth (MAALOX/MYLANTA) 200-200-20 MG/5ML suspension 30 mL  30 mL Oral Q4H PRN Shuvon Rankin, NP      . benztropine (COGENTIN) tablet 1 mg  1 mg Oral BID Kaedon Fanelli   1 mg at 05/01/13 0758  . divalproex (DEPAKOTE) DR tablet 500 mg  500 mg Oral BID PC Chad Donoghue   500 mg at 05/01/13 0846  . LORazepam (ATIVAN) tablet 1 mg  1 mg Oral Q8H PRN Jack Mineau      . magnesium hydroxide (MILK OF MAGNESIA) suspension 30 mL  30 mL Oral Daily PRN Benjaman Pott, MD      . Melene Muller ON 05/02/2013] NONFORMULARY OR COMPOUNDED ITEM 1 application  1 application Topical Once Mahsa Hanser      . traZODone (DESYREL) tablet 50 mg  50 mg Oral QHS PRN Fransisca Kaufmann, NP   50 mg at 04/30/13 2143  . tuberculin injection  5 Units  5 Units Intradermal Once Fransisca Kaufmann, NP   5 Units at 04/30/13 1139  . ziprasidone (GEODON) capsule 60 mg  60 mg Oral BID WC Kaithlyn Teagle   60 mg at 05/01/13 1610    Lab Results:  Results for orders placed during the hospital encounter of 04/25/13 (from the past 48 hour(s))  VALPROIC ACID LEVEL     Status: None   Collection Time    04/30/13  6:20 AM      Result Value Range   Valproic Acid Lvl 78.1  50.0 - 100.0 ug/mL   Comment: Performed at [facility]    Physical Findings: AIMS: Facial and  Oral Movements Muscles of Facial Expression: None, normal Lips and Perioral Area: None, normal Jaw: None, normal Tongue: None, normal,Extremity Movements Upper (arms, wrists, hands, fingers): None, normal Lower (legs, knees, ankles, toes): None, normal, Trunk Movements Neck, shoulders, hips: None, normal, Overall Severity Severity of abnormal movements (highest score from questions above): None, normal Incapacitation due to abnormal movements: None, normal Patient's awareness of abnormal movements (rate only patient's report): No Awareness, Dental Status Current problems with teeth and/or dentures?: No Does patient usually wear dentures?: No  CIWA:  CIWA-Ar Total: 1 COWS:  COWS Total Score: 1  Treatment Plan Summary: Daily contact with patient to assess and evaluate symptoms and progress in treatment Medication management  Plan: Continue crisis management and stabilization.  Medication management: Continue Depakote DR to  500 mg po BID for improved stability of mood. Continue Geodon 80 mg daily at bedtime for psychosis.  Encouraged patient to attend groups and participate in group counseling sessions and activities.  Discharge plan in progress.  Continue current treatment plan.  Address health issues: Vitals reviewed and stable.  Medical Decision Making Problem Points:  Established problem, stable/improving (1) and Review of psycho-social stressors (1) Data Points:  Review of medication regiment & side effects (2)  I certify that inpatient services furnished can reasonably be expected to improve the patient's condition.   Thedore Mins, MD 05/01/2013, 11:02 AM

## 2013-05-01 NOTE — [facility] Group Notes (Signed)
[facility] Group Notes:  (Nursing/MHT/Case Management/Adjunct)  Date:  05/01/2013 Time:  12:47 PM  Type of Therapy:  Group Therapy   Participation Level:  Active  Participation Quality:  Appropriate  Affect:  Appropriate and Excited  Cognitive:  Appropriate  Insight:  Appropriate  Engagement in Group:  Engaged  Modes of Intervention: Discussion, Exploration and Socialization   Summary of Progress/Problems:  The topic for group was balance in life. Pt participated in the discussion about when their life was in balance and out of balance and how this feels. Pt discussed ways to get back in balance and short term goals they can work on to get where they want to be.  Jerry Frye indicated that he keeps on pushing forward during adversity.  Stated that he is 100% balanced because he feels cheerful and everything is positive today.  He was scared yesterday after his appointment with the state PASARR assessor.  Was skeptical about it since he has never lived in an assisted living facility, however, was able to receive more information about assisted living facilities from a staff member.    To stay balanced, he stated that he likes to shoot pool because he is distracted from other thoughts or worries when he focuses on the game.  Simona Huh MSW Intern  05/01/2013 12:47 PM

## 2013-05-02 DIAGNOSIS — F1210 Cannabis abuse, uncomplicated: Secondary | ICD-10-CM

## 2013-05-02 MED ORDER — BENZTROPINE MESYLATE 1 MG PO TABS: 1.0000 mg | ORAL_TABLET | Freq: Two times a day (BID) | ORAL | Status: DC

## 2013-05-02 MED ORDER — TRAZODONE HCL 50 MG PO TABS: 50.0000 mg | ORAL_TABLET | Freq: Every evening | ORAL | Status: DC | PRN

## 2013-05-02 MED ORDER — DIVALPROEX SODIUM 500 MG PO DR TAB: 500.0000 mg | DELAYED_RELEASE_TABLET | Freq: Two times a day (BID) | ORAL | Status: DC

## 2013-05-02 MED ORDER — ZIPRASIDONE HCL 60 MG PO CAPS: 60.0000 mg | ORAL_CAPSULE | Freq: Two times a day (BID) | ORAL | Status: DC

## 2013-05-02 NOTE — [facility] Suicide Risk Assessment (Signed)
Suicide Risk Assessment  Discharge Assessment     Demographic Factors:  Male, Caucasian, Low socioeconomic status and Unemployed  Mental Status Per Nursing Assessment::   On Admission:     Current Mental Status by Physician: patient denies suicide ideation, intent or plan  Loss Factors: Decrease in vocational status and Financial problems/change in socioeconomic status  Historical Factors: Impulsivity  Risk Reduction Factors:   Sense of responsibility to family, Living with another person, especially a relative, Positive social support and Positive therapeutic relationship  Continued Clinical Symptoms:  Alcohol/Substance Abuse/Dependencies  Cognitive Features That Contribute To Risk:  Closed-mindedness Polarized thinking    Suicide Risk:  Minimal: No identifiable suicidal ideation.  Patients presenting with no risk factors but with morbid ruminations; may be classified as minimal risk based on the severity of the depressive symptoms  Discharge Diagnoses:   AXIS I:  Schizoaffective disorder bipolar type              Cannabis use disorder  AXIS II:  Deferred AXIS III:   Past Medical History  Diagnosis Date   AXIS IV:  economic problems, other psychosocial or environmental problems and problems related to social environment AXIS V:  61-70 mild symptoms  Plan Of Care/Follow-up recommendations:  Activity:  as tolerated Diet:  healthy Tests:  Valproic acid level 78.1 Other:  patient to keep her after care appointment  Is patient on multiple antipsychotic therapies at discharge:  No   Has Patient had three or more failed trials of antipsychotic monotherapy by history:  No  Recommended Plan for Multiple Antipsychotic Therapies: N/A  Thedore Mins, MD 05/02/2013, 9:17 AM

## 2013-05-02 NOTE — Progress Notes (Signed)
[facility] Adult Case Management Discharge Plan :  Will you be returning to the same living situation after discharge: No. At discharge, do you have transportation home?:Yes,  [facility] Do you have the ability to pay for your medications:Yes,  MCD  Release of information consent forms completed and in the chart;  Patient's signature needed at discharge.  Patient to Follow up at: Follow-up Information   Follow up with [facility] On 05/07/2013. (Go to the walk-in clinic between 8 and 10AM on Wednesday morning to be opened for services)    Contact information:   [address]  [place]  [phone]      Patient denies SI/HI:   Yes,  yes    Safety Planning and Suicide Prevention discussed:  Yes,  yes  Daryel Gerald B 05/02/2013, 10:41 AM

## 2013-05-02 NOTE — Plan of Care (Signed)
Problem: Alteration in thought process Goal: LTG-Patient verbalizes understanding importance med regimen (Patient verbalizes understanding of importance of medication regimen and need to continue outpatient care.)  Outcome: Completed/Met Date Met:  05/02/13 Taking meds as prescribed with teachback Goal: STG-Patient is able to discuss thoughts with staff Outcome: Completed/Met Date Met:  05/02/13 Pt denies si Goal: STG-Patient is able to identify plan for continuing care at ( Patient is able to identify continuing plan for care at discharge)  Outcome: Completed/Met Date Met:  05/02/13 [facility]

## 2013-05-02 NOTE — Progress Notes (Signed)
Pt d/c from hospital to group home. All items returned. D/C instructions given, prescriptions given and samples given. Pt denies si and hi.

## 2013-05-02 NOTE — Progress Notes (Signed)
Seen and agreed. Leni Pankonin, MD 

## 2013-05-02 NOTE — Discharge Summary (Signed)
Physician Discharge Summary Note  Patient:  Jerry Frye is an 42 y.o., male MRN:  [id number] DOB:  [birth year] Patient phone:  [phone] (home)  Patient address:   [address] [place] [address]   Date of Admission:  04/25/2013 Date of Discharge: 05/02/13  Discharge Diagnoses: Principal Problem:   Schizoaffective disorder Active Problems:   Cannabis abuse  Axis Diagnosis:  AXIS I: Schizoaffective disorder bipolar type  Cannabis use disorder  AXIS II: Deferred  AXIS III:  Past Medical History   Diagnosis  Date   AXIS IV: economic problems, other psychosocial or environmental problems and problems related to social environment  AXIS V: 61-70 mild symptoms   Level of Care:  OP  Hospital Course:   Jerry Frye is a 42 year old male who presented voluntarily to [facility] after being off his medications for five weeks. The patient has a history of schizophrenia and admits to traveling to multiple states over the last month. Patient states "I was walking and got a headache. I've been off my medications. I was out looking for a home. I went to [facility]. And [place] but felt that I did not belong there. I can't find a home. So I came back here." Patient demonstrates bizarre behaviors assuming a lotus position and folding hands in prayer upon Charity fundraiser. He reports that his main supports are his sister and his mother.   While a patient in this hospital, Jerry Frye was enrolled in group counseling and activities as well as received the following medication Current facility-administered medications:acetaminophen (TYLENOL) tablet 650 mg, 650 mg, Oral, Q6H PRN, Shuvon Rankin, NP;  alum & mag hydroxide-simeth (MAALOX/MYLANTA) 200-200-20 MG/5ML suspension 30 mL, 30 mL, Oral, Q4H PRN, Shuvon Rankin, NP;  benztropine (COGENTIN) tablet 1 mg, 1 mg, Oral, BID, Mojeed Akintayo, 1 mg at 05/02/13 0839;  divalproex (DEPAKOTE) DR tablet 500 mg, 500 mg, Oral, BID PC, Mojeed  Akintayo, 500 mg at 05/02/13 0841 LORazepam (ATIVAN) tablet 1 mg, 1 mg, Oral, Q8H PRN, Mojeed Akintayo;  magnesium hydroxide (MILK OF MAGNESIA) suspension 30 mL, 30 mL, Oral, Daily PRN, Benjaman Pott, MD;  traZODone (DESYREL) tablet 50 mg, 50 mg, Oral, QHS PRN, Fransisca Kaufmann, NP, 50 mg at 04/30/13 2143;  ziprasidone (GEODON) capsule 60 mg, 60 mg, Oral, BID WC, Mojeed Akintayo, 60 mg at 05/02/13 0840 Patient's medications were managed by the MD. His Depakote was increased to 500 mg BID. His level was 78.1 at discharge which was therapeutic. His geodon was also increased to 60 mg BID for manic behaviors. Patient displayed some bizarre behaviors on the unit such as greeting people assuming yoga-like positions. Patient did not present any behavioral problems during his admission. His sister felt as though the patient would benefit from a more structured environment to reduce his wandering behaviors. Patient was interviewed by a group home representative prior to his discharge from [facility].  Patient attended treatment team meeting this am and met with treatment team members. Pt symptoms, treatment plan and response to treatment discussed. Jerry Frye endorsed that their symptoms have improved. Pt also stated that they are stable for discharge.  In other to control Principal Problem:   Schizoaffective disorder Active Problems:   Cannabis abuse , they will continue psychiatric care on outpatient basis. They will follow-up at      Follow-up Information   Follow up with [facility] On 05/07/2013. (Go to the walk-in clinic between 8 and 10AM  M-F for your hospital  follow up appointment.  When your medicaid has been changed over to [facility], you will be able to be seen at [facility] in [place] [phone] or [facility] [phone])    Contact information:   [name]  [place]  [336] [phone]    .  In addition they were instructed to take all your medications as prescribed by your mental  healthcare provider, to report any adverse effects and or reactions from your medicines to your outpatient provider promptly, patient is instructed and cautioned to not engage in alcohol and or illegal drug use while on prescription medicines, in the event of worsening symptoms, patient is instructed to call the crisis hotline, 911 and or go to the nearest ED for appropriate evaluation and treatment of symptoms.   Upon discharge, patient adamantly denies suicidal, homicidal ideations, auditory, visual hallucinations and or delusional thinking. They left [facility] with all personal belongings in no apparent distress.  Consults:  See electronic record for details  Significant Diagnostic Studies:  See electronic record for details  Discharge Vitals:   Blood pressure 89/66, pulse 93, temperature 97.6 F (36.4 C), temperature source Oral, resp. rate 16, height 5\' 10"  (1.778 m), weight 76.204 kg (168 lb), SpO2 98.00%..  Mental Status Exam: See Mental Status Examination and Suicide Risk Assessment completed by Attending Physician prior to discharge.  Discharge destination:  Other:  [facility]  Is patient on multiple antipsychotic therapies at discharge:  No  Has Patient had three or more failed trials of antipsychotic monotherapy by history: N/A Recommended Plan for Multiple Antipsychotic Therapies: N/A    Medication List    STOP taking these medications       clonazePAM 1 MG tablet  Commonly known as:  KLONOPIN      TAKE these medications     Indication   benztropine 1 MG tablet  Commonly known as:  COGENTIN  Take 1 tablet (1 mg total) by mouth 2 (two) times daily.   Indication:  Extrapyramidal Reaction caused by Medications     divalproex 500 MG DR tablet  Commonly known as:  DEPAKOTE  Take 1 tablet (500 mg total) by mouth 2 (two) times daily after a meal.   Indication:  Manic Phase of Manic-Depression     traZODone 50 MG tablet  Commonly known as:  DESYREL  Take 1  tablet (50 mg total) by mouth at bedtime as needed for sleep.   Indication:  Trouble Sleeping     ziprasidone 60 MG capsule  Commonly known as:  GEODON  Take 1 capsule (60 mg total) by mouth 2 (two) times daily with a meal.   Indication:  Manic-Depression       Follow-up Information   Follow up with [facility] On 05/07/2013. (Go to the walk-in clinic between 8 and 10AM  M-F for your hospital follow up appointment.  When your medicaid has been changed over to [facility], you will be able to be seen at [facility] in [place] [phone] or [facility] [phone])    Contact information:   [address]  [place]  [336] [phone]     Follow-up recommendations:   Activities: Resume typical activities Diet: Resume typical diet Tests: none Other: Follow up with outpatient provider and report any side effects to out patient prescriber.  Comments:  Take all your medications as prescribed by your mental healthcare provider. Report any adverse effects and or reactions from  your medicines to your outpatient provider promptly. Patient is instructed and cautioned to not engage in alcohol and or illegal drug use while on prescription medicines. In the event of worsening symptoms, patient is instructed to call the crisis hotline, 911 and or go to the nearest ED for appropriate evaluation and treatment of symptoms. Follow-up with your primary care provider for your other medical issues, concerns and or health care needs.  SignedFransisca Kaufmann NP-C 05/02/2013 4:50 PM

## 2013-05-02 NOTE — Progress Notes (Signed)
Recreation Therapy Notes  Date: 10.17.2014 Time: 9:30am Location: [address] Dayroom  Group Topic: Leisure Education  Goal Area(s) Addresses:  Patient will identify appropriate leisure activity to correspond with letters of the alphabet.  Patient will identify benefit of leisure.   Behavioral Response: Engaged, Appropriate, Redirectable  Intervention: Game  Activity: Leisure ABC's. As a group patients were asked to identify leisure activities to correspond with each letter of the alphabet.   Education:  Leisure Education, Building control surveyor  Education Outcome: Acknowledges education  Clinical Observations/Feedback: Patient participated in group session, stating appropriate leisure activities to correspond with letters of the alphabet. Patient additionally stated a benefit of leisure is to build bonds with support system, as well as relax.   Marykay Lex Leroy Pettway, LRT/CTRS  Jearl Klinefelter 05/02/2013 12:53 PM

## 2013-05-05 NOTE — Discharge Summary (Signed)
Seen and agreed. Meiya Wisler, MD 

## 2013-05-06 NOTE — Progress Notes (Signed)
Patient Discharge Instructions:  After Visit Summary (AVS):   Faxed to:  05/06/13 Discharge Summary Note:   Faxed to:  05/06/13 Psychiatric Admission Assessment Note:   Faxed to:  05/06/13 Suicide Risk Assessment - Discharge Assessment:   Faxed to:  05/06/13 Faxed/Sent to the Next Level Care provider:  05/06/13 Faxed to [facility] @ [phone] Faxed to [facility] @ [phone]  Jerelene Redden, 05/06/2013, 3:21 PM

## 2014-12-11 NOTE — Progress Notes (Signed)
Unable to reach pt by phone for pre-op call. Left instructions on VM. Instructed pt to be here Tuesday, 12/15/14 at 8:45 AM. NPO after MN Monday. There was not an up to date medication list available, instructed pt not to take any medications the morning of surgery. Also instructed him on no jewelry, money, lotions, powders and cologne.

## 2014-12-15 ENCOUNTER — Ambulatory Visit (HOSPITAL_COMMUNITY): Payer: Medicaid Other | Admitting: Certified Registered Nurse Anesthetist

## 2014-12-15 ENCOUNTER — Encounter (HOSPITAL_COMMUNITY)
Admission: RE | Disposition: A | Discharge status: Home / Self Care | Payer: Self-pay | Source: Ambulatory Visit | Attending: General Surgery

## 2014-12-15 ENCOUNTER — Encounter (HOSPITAL_COMMUNITY): Payer: Self-pay | Admitting: *Deleted

## 2014-12-15 ENCOUNTER — Observation Stay (HOSPITAL_COMMUNITY)
Admission: RE | Admit: 2014-12-15 | Discharge: 2014-12-16 | Disposition: A | Discharge status: Home / Self Care | Payer: Medicaid Other | Source: Ambulatory Visit | Attending: General Surgery | Admitting: General Surgery

## 2014-12-15 DIAGNOSIS — Z881 Allergy status to other antibiotic agents status: Secondary | ICD-10-CM | POA: Insufficient documentation

## 2014-12-15 DIAGNOSIS — Z8719 Personal history of other diseases of the digestive system: Secondary | ICD-10-CM

## 2014-12-15 DIAGNOSIS — Z880 Allergy status to penicillin: Secondary | ICD-10-CM | POA: Diagnosis not present

## 2014-12-15 DIAGNOSIS — Z87891 Personal history of nicotine dependence: Secondary | ICD-10-CM | POA: Insufficient documentation

## 2014-12-15 DIAGNOSIS — K4090 Unilateral inguinal hernia, without obstruction or gangrene, not specified as recurrent: Secondary | ICD-10-CM | POA: Diagnosis not present

## 2014-12-15 DIAGNOSIS — Z9889 Other specified postprocedural states: Secondary | ICD-10-CM

## 2014-12-15 HISTORY — PX: INGUINAL HERNIA REPAIR: SHX194

## 2014-12-15 HISTORY — PX: INGUINAL HERNIA REPAIR: SUR1180

## 2014-12-15 LAB — CBC
HCT: 44.4 % (ref 39.0–52.0)
HEMOGLOBIN: 15.8 g/dL (ref 13.0–17.0)
MCH: 32.8 pg (ref 26.0–34.0)
MCHC: 35.6 g/dL (ref 30.0–36.0)
MCV: 92.1 fL (ref 78.0–100.0)
Platelets: 346 10*3/uL (ref 150–400)
RBC: 4.82 MIL/uL (ref 4.22–5.81)
RDW: 12.7 % (ref 11.5–15.5)
WBC: 8.2 10*3/uL (ref 4.0–10.5)

## 2014-12-15 SURGERY — REPAIR, HERNIA, INGUINAL, ADULT
Anesthesia: Regional | Site: Groin | Laterality: Right

## 2014-12-15 MED ORDER — NEOSTIGMINE METHYLSULFATE 10 MG/10ML IV SOLN
INTRAVENOUS | Status: AC
  Filled 2014-12-15: qty 1

## 2014-12-15 MED ORDER — HYDROCODONE-ACETAMINOPHEN 5-325 MG PO TABS
1.0000 | ORAL_TABLET | ORAL | Status: DC | PRN
  Administered 2014-12-16: 2 via ORAL
  Filled 2014-12-15: qty 2

## 2014-12-15 MED ORDER — FENTANYL CITRATE (PF) 100 MCG/2ML IJ SOLN
INTRAMUSCULAR | Status: DC | PRN
  Administered 2014-12-15: 100 ug via INTRAVENOUS
  Administered 2014-12-15 (×3): 50 ug via INTRAVENOUS

## 2014-12-15 MED ORDER — ROCURONIUM BROMIDE 100 MG/10ML IV SOLN
INTRAVENOUS | Status: DC | PRN
  Administered 2014-12-15: 50 mg via INTRAVENOUS

## 2014-12-15 MED ORDER — LIDOCAINE HCL (CARDIAC) 20 MG/ML IV SOLN
INTRAVENOUS | Status: DC | PRN
  Administered 2014-12-15: 40 mg via INTRAVENOUS

## 2014-12-15 MED ORDER — ACETAMINOPHEN 10 MG/ML IV SOLN
1000.0000 mg | INTRAVENOUS | Status: AC
  Administered 2014-12-15: 1000 mg via INTRAVENOUS
  Filled 2014-12-15: qty 100

## 2014-12-15 MED ORDER — ACETAMINOPHEN 325 MG PO TABS
ORAL_TABLET | ORAL | Status: AC
  Administered 2014-12-15: 650 mg via ORAL
  Filled 2014-12-15: qty 2

## 2014-12-15 MED ORDER — DEXAMETHASONE SODIUM PHOSPHATE 4 MG/ML IJ SOLN
INTRAMUSCULAR | Status: DC | PRN
  Administered 2014-12-15: 4 mg via INTRAVENOUS

## 2014-12-15 MED ORDER — ONDANSETRON HCL 4 MG/2ML IJ SOLN
INTRAMUSCULAR | Status: AC
  Filled 2014-12-15: qty 2

## 2014-12-15 MED ORDER — OXYCODONE HCL 5 MG PO TABS
5.0000 mg | ORAL_TABLET | ORAL | Status: DC | PRN
  Administered 2014-12-15 (×2): 10 mg via ORAL
  Filled 2014-12-15: qty 2

## 2014-12-15 MED ORDER — BUPIVACAINE-EPINEPHRINE (PF) 0.25% -1:200000 IJ SOLN
INTRAMUSCULAR | Status: AC
  Filled 2014-12-15: qty 30

## 2014-12-15 MED ORDER — BUPIVACAINE-EPINEPHRINE (PF) 0.5% -1:200000 IJ SOLN
INTRAMUSCULAR | Status: DC | PRN
  Administered 2014-12-15: 30 mL

## 2014-12-15 MED ORDER — DIVALPROEX SODIUM 500 MG PO DR TAB
500.0000 mg | DELAYED_RELEASE_TABLET | Freq: Two times a day (BID) | ORAL | Status: DC
  Administered 2014-12-15: 500 mg via ORAL
  Filled 2014-12-15 (×4): qty 1

## 2014-12-15 MED ORDER — MIDAZOLAM HCL 2 MG/2ML IJ SOLN
INTRAMUSCULAR | Status: AC
Start: 2014-12-15 — End: 2014-12-15
  Administered 2014-12-15: 2 mg
  Filled 2014-12-15: qty 2

## 2014-12-15 MED ORDER — FENTANYL CITRATE (PF) 100 MCG/2ML IJ SOLN
INTRAMUSCULAR | Status: AC
  Administered 2014-12-15: 100 ug
  Filled 2014-12-15: qty 2

## 2014-12-15 MED ORDER — EPHEDRINE SULFATE 50 MG/ML IJ SOLN
INTRAMUSCULAR | Status: AC
  Filled 2014-12-15: qty 1

## 2014-12-15 MED ORDER — ROCURONIUM BROMIDE 50 MG/5ML IV SOLN
INTRAVENOUS | Status: AC
  Filled 2014-12-15: qty 1

## 2014-12-15 MED ORDER — VANCOMYCIN HCL IN DEXTROSE 1-5 GM/200ML-% IV SOLN
1000.0000 mg | Freq: Once | INTRAVENOUS | Status: AC
  Administered 2014-12-15: 1000 mg via INTRAVENOUS
  Filled 2014-12-15: qty 200

## 2014-12-15 MED ORDER — BENZTROPINE MESYLATE 1 MG PO TABS
1.0000 mg | ORAL_TABLET | Freq: Two times a day (BID) | ORAL | Status: DC
  Administered 2014-12-15: 1 mg via ORAL
  Filled 2014-12-15 (×3): qty 1

## 2014-12-15 MED ORDER — HYDROMORPHONE HCL 1 MG/ML IJ SOLN
0.2500 mg | INTRAMUSCULAR | Status: DC | PRN
  Administered 2014-12-15: 0.5 mg via INTRAVENOUS

## 2014-12-15 MED ORDER — ONDANSETRON HCL 4 MG/2ML IJ SOLN: 4.0000 mg | Freq: Four times a day (QID) | INTRAMUSCULAR | Status: DC | PRN

## 2014-12-15 MED ORDER — PROPOFOL 10 MG/ML IV BOLUS
INTRAVENOUS | Status: AC
  Filled 2014-12-15: qty 40

## 2014-12-15 MED ORDER — LACTATED RINGERS IV SOLN
INTRAVENOUS | Status: DC
  Administered 2014-12-15 (×2): via INTRAVENOUS

## 2014-12-15 MED ORDER — GLYCOPYRROLATE 0.2 MG/ML IJ SOLN
INTRAMUSCULAR | Status: AC
  Filled 2014-12-15: qty 3

## 2014-12-15 MED ORDER — ONDANSETRON HCL 4 MG/2ML IJ SOLN
INTRAMUSCULAR | Status: DC | PRN
  Administered 2014-12-15: 4 mg via INTRAVENOUS

## 2014-12-15 MED ORDER — PROPOFOL 10 MG/ML IV BOLUS
INTRAVENOUS | Status: DC | PRN
  Administered 2014-12-15: 200 mg via INTRAVENOUS

## 2014-12-15 MED ORDER — GLYCOPYRROLATE 0.2 MG/ML IJ SOLN
INTRAMUSCULAR | Status: DC | PRN
  Administered 2014-12-15: 0.6 mg via INTRAVENOUS

## 2014-12-15 MED ORDER — 0.9 % SODIUM CHLORIDE (POUR BTL) OPTIME
TOPICAL | Status: DC | PRN
  Administered 2014-12-15: 1000 mL

## 2014-12-15 MED ORDER — ARTIFICIAL TEARS OP OINT
TOPICAL_OINTMENT | OPHTHALMIC | Status: DC | PRN
  Administered 2014-12-15: 1 via OPHTHALMIC

## 2014-12-15 MED ORDER — HYDROMORPHONE HCL 1 MG/ML IJ SOLN
INTRAMUSCULAR | Status: AC
  Administered 2014-12-15: 0.5 mg via INTRAVENOUS
  Filled 2014-12-15: qty 1

## 2014-12-15 MED ORDER — HYDROMORPHONE HCL 1 MG/ML IJ SOLN: 1.0000 mg | INTRAMUSCULAR | Status: DC | PRN

## 2014-12-15 MED ORDER — HEPARIN SODIUM (PORCINE) 1000 UNIT/ML IJ SOLN
INTRAMUSCULAR | Status: AC
  Filled 2014-12-15: qty 1

## 2014-12-15 MED ORDER — MIDAZOLAM HCL 2 MG/2ML IJ SOLN
INTRAMUSCULAR | Status: AC
  Filled 2014-12-15: qty 2

## 2014-12-15 MED ORDER — OXYCODONE HCL 5 MG PO TABS
ORAL_TABLET | ORAL | Status: AC
  Administered 2014-12-15: 10 mg via ORAL
  Filled 2014-12-15: qty 2

## 2014-12-15 MED ORDER — ACETAMINOPHEN 650 MG RE SUPP
650.0000 mg | RECTAL | Status: DC | PRN
  Filled 2014-12-15: qty 1

## 2014-12-15 MED ORDER — ZIPRASIDONE HCL 60 MG PO CAPS
60.0000 mg | ORAL_CAPSULE | Freq: Two times a day (BID) | ORAL | Status: DC
  Administered 2014-12-15 – 2014-12-16 (×2): 60 mg via ORAL
  Filled 2014-12-15 (×4): qty 1

## 2014-12-15 MED ORDER — ARTIFICIAL TEARS OP OINT
TOPICAL_OINTMENT | OPHTHALMIC | Status: AC
Start: 2014-12-15 — End: 2014-12-15
  Filled 2014-12-15: qty 3.5

## 2014-12-15 MED ORDER — SUCCINYLCHOLINE CHLORIDE 20 MG/ML IJ SOLN
INTRAMUSCULAR | Status: AC
  Filled 2014-12-15: qty 1

## 2014-12-15 MED ORDER — NEOSTIGMINE METHYLSULFATE 10 MG/10ML IV SOLN
INTRAVENOUS | Status: DC | PRN
  Administered 2014-12-15: 4 mg via INTRAVENOUS

## 2014-12-15 MED ORDER — LIDOCAINE HCL (CARDIAC) 20 MG/ML IV SOLN
INTRAVENOUS | Status: AC
  Filled 2014-12-15: qty 5

## 2014-12-15 MED ORDER — BUPIVACAINE-EPINEPHRINE 0.25% -1:200000 IJ SOLN
INTRAMUSCULAR | Status: DC | PRN
  Administered 2014-12-15: 20 mL

## 2014-12-15 MED ORDER — OXYCODONE-ACETAMINOPHEN 7.5-325 MG PO TABS: 1.0000 | ORAL_TABLET | ORAL | Status: DC | PRN

## 2014-12-15 MED ORDER — FENTANYL CITRATE (PF) 250 MCG/5ML IJ SOLN
INTRAMUSCULAR | Status: AC
  Filled 2014-12-15: qty 5

## 2014-12-15 MED ORDER — ACETAMINOPHEN 325 MG PO TABS
650.0000 mg | ORAL_TABLET | ORAL | Status: DC | PRN
Start: 2014-12-15 — End: 2014-12-16
  Administered 2014-12-15: 650 mg via ORAL
  Filled 2014-12-15: qty 2

## 2014-12-15 MED ORDER — SODIUM CHLORIDE 0.9 % IJ SOLN
INTRAMUSCULAR | Status: AC
  Filled 2014-12-15: qty 10

## 2014-12-15 MED ORDER — MIDAZOLAM HCL 5 MG/5ML IJ SOLN
INTRAMUSCULAR | Status: DC | PRN
  Administered 2014-12-15: 2 mg via INTRAVENOUS

## 2014-12-15 SURGICAL SUPPLY — 39 items
BLADE SURG ROTATE 9660 (MISCELLANEOUS) ×2 IMPLANT
CHLORAPREP W/TINT 26ML (MISCELLANEOUS) ×3 IMPLANT
COVER SURGICAL LIGHT HANDLE (MISCELLANEOUS) ×3 IMPLANT
DRAPE LAPAROTOMY TRNSV 102X78 (DRAPE) ×3 IMPLANT
DRAPE UTILITY XL STRL (DRAPES) ×6 IMPLANT
ELECT CAUTERY BLADE 6.4 (BLADE) ×3 IMPLANT
ELECT REM PT RETURN 9FT ADLT (ELECTROSURGICAL) ×3
ELECTRODE REM PT RTRN 9FT ADLT (ELECTROSURGICAL) ×1 IMPLANT
GAUZE SPONGE 4X4 16PLY XRAY LF (GAUZE/BANDAGES/DRESSINGS) ×3 IMPLANT
GLOVE BIO SURGEON STRL SZ7.5 (GLOVE) ×5 IMPLANT
GLOVE BIOGEL PI IND STRL 8 (GLOVE) ×1 IMPLANT
GLOVE BIOGEL PI INDICATOR 8 (GLOVE) ×2
GOWN STRL REUS W/ TWL LRG LVL3 (GOWN DISPOSABLE) ×1 IMPLANT
GOWN STRL REUS W/ TWL XL LVL3 (GOWN DISPOSABLE) ×1 IMPLANT
GOWN STRL REUS W/TWL LRG LVL3 (GOWN DISPOSABLE) ×3
GOWN STRL REUS W/TWL XL LVL3 (GOWN DISPOSABLE) ×3
KIT BASIN OR (CUSTOM PROCEDURE TRAY) ×3 IMPLANT
KIT ROOM TURNOVER OR (KITS) ×3 IMPLANT
LIQUID BAND (GAUZE/BANDAGES/DRESSINGS) ×3 IMPLANT
MESH PARIETEX PROGRIP RIGHT (Mesh General) ×2 IMPLANT
NDL HYPO 25GX1X1/2 BEV (NEEDLE) ×1 IMPLANT
NEEDLE HYPO 25GX1X1/2 BEV (NEEDLE) ×3 IMPLANT
NS IRRIG 1000ML POUR BTL (IV SOLUTION) ×3 IMPLANT
PACK SURGICAL SETUP 50X90 (CUSTOM PROCEDURE TRAY) ×3 IMPLANT
PAD ARMBOARD 7.5X6 YLW CONV (MISCELLANEOUS) ×6 IMPLANT
PENCIL BUTTON HOLSTER BLD 10FT (ELECTRODE) ×3 IMPLANT
SLEEVE SURGEON STRL (DRAPES) ×2 IMPLANT
SPONGE LAP 18X18 X RAY DECT (DISPOSABLE) ×3 IMPLANT
SUT MNCRL AB 4-0 PS2 18 (SUTURE) ×3 IMPLANT
SUT PROLENE 2 0 SH DA (SUTURE) ×3 IMPLANT
SUT VIC AB 2-0 SH 27 (SUTURE) ×9
SUT VIC AB 2-0 SH 27X BRD (SUTURE) ×1 IMPLANT
SUT VIC AB 3-0 SH 27 (SUTURE) ×3
SUT VIC AB 3-0 SH 27XBRD (SUTURE) ×1 IMPLANT
SUT VICRYL 0 UR6 27IN ABS (SUTURE) ×2 IMPLANT
SUT VICRYL AB 2 0 TIES (SUTURE) ×3 IMPLANT
SYR CONTROL 10ML LL (SYRINGE) ×3 IMPLANT
TOWEL OR 17X26 10 PK STRL BLUE (TOWEL DISPOSABLE) ×3 IMPLANT
TRAY FOLEY CATH SILVER 16FR (SET/KITS/TRAYS/PACK) ×2 IMPLANT

## 2014-12-15 NOTE — Transfer of Care (Signed)
Immediate Anesthesia Transfer of Care Note  Patient: Jerry Frye  Procedure(s) Performed: Procedure(s): RIGHT INGUINAL HERNIA REPAIR WITH MESH (Right)  Patient Location: PACU  Anesthesia Type:General  Level of Consciousness: awake, alert , oriented and patient cooperative  Airway & Oxygen Therapy: Patient Spontanous Breathing and Patient connected to nasal cannula oxygen  Post-op Assessment: Report given to RN, Post -op Vital signs reviewed and stable and Patient moving all extremities X 4  Post vital signs: Reviewed and stable  Last Vitals:  HR 85 SpO2 94 on 2L [place]  BP 126/92 (100) RR 16  Complications: No apparent anesthesia complications

## 2014-12-15 NOTE — Anesthesia Postprocedure Evaluation (Signed)
  Anesthesia Post-op Note  Patient: Jerry Frye  Procedure(s) Performed: Procedure(s): RIGHT INGUINAL HERNIA REPAIR WITH MESH (Right)  Patient Location: PACU  Anesthesia Type:General and block  Level of Consciousness: awake and alert   Airway and Oxygen Therapy: Patient Spontanous Breathing  Post-op Pain: mild  Post-op Assessment: Post-op Vital signs reviewed, Patient's Cardiovascular Status Stable and Respiratory Function Stable  Post-op Vital Signs: Reviewed  Filed Vitals:   12/15/14 1315  BP: 126/92  Pulse: 87  Temp: 36.7 C  Resp: 14    Complications: No apparent anesthesia complications

## 2014-12-15 NOTE — Progress Notes (Signed)
Dr. Derrell Lollingamirez in PACU to see patient. New order to admit for overnight observation.

## 2014-12-15 NOTE — H&P (Signed)
History of Present Illness Jerry Filler MD; 11/26/2014 11:08 AM) Patient words: New Patient.  The patient is a 44 year old male who presents with an inguinal hernia. 44 year old male who is referred by Jerry Ham, NP for evaluation of a large right inguinal hernia. The patient states he's had this hernia for 5-8 years. He states that his gotten bigger over that period of time. He does state that is reducible when he lies down and reduce it manually. The patient has had no signs or symptoms of strangulation.   Other Problems Jerry Glance, LPN; 10/23/8117 14:78 AM) No pertinent past medical history  Past Surgical History Jerry Glance, LPN; 2/95/6213 08:65 AM) No pertinent past surgical history  Diagnostic Studies History Jerry Glance, LPN; 7/84/6962 95:28 AM) Colonoscopy never  Allergies Jerry Glance, LPN; 10/28/2438 10:27 AM) Penicillins Anaphylaxis. Ciprofloxacin *CHEMICALS* Anaphylaxis.  Medication History Jerry Glance, LPN; 2/53/6644 03:47 AM) Benztropine Mesylate (  Tablet, Oral) Active. Depakote (  Tablet DR, Oral daily) Active. Ziprasidone HCl (  Capsule, Oral) Active. Medications Reconciled  Review of Systems [facility] LPN; 11/08/9561 87:56 AM) General Present- Weight Gain. Not Present- Appetite Loss, Chills, Fatigue, Fever, Night Sweats and Weight Loss. Skin Present- Rash. Not Present- Change in Wart/Mole, Dryness, Hives, Jaundice, New Lesions, Non-Healing Wounds and Ulcer. HEENT Not Present- Earache, Hearing Loss, Hoarseness, Nose Bleed, Oral Ulcers, Ringing in the Ears, Seasonal Allergies, Sinus Pain, Sore Throat, Visual Disturbances, Wears glasses/contact lenses and Yellow Eyes. Respiratory Not Present- Bloody sputum, Chronic Cough, Difficulty Breathing, Snoring and Wheezing. Breast Not Present- Breast Mass, Breast Pain, Nipple Discharge and Skin Changes. Cardiovascular Not Present- Chest Pain, Difficulty Breathing Lying Down, Leg  Cramps, Palpitations, Rapid Heart Rate, Shortness of Breath and Swelling of Extremities. Gastrointestinal Not Present- Abdominal Pain, Bloating, Bloody Stool, Change in Bowel Habits, Chronic diarrhea, Constipation, Difficulty Swallowing, Excessive gas, Gets full quickly at meals, Hemorrhoids, Indigestion, Nausea, Rectal Pain and Vomiting. Male Genitourinary Not Present- Blood in Urine, Change in Urinary Stream, Frequency, Impotence, Nocturia, Painful Urination, Urgency and Urine Leakage. Musculoskeletal Not Present- Back Pain, Joint Pain, Joint Stiffness, Muscle Pain, Muscle Weakness and Swelling of Extremities. Neurological Not Present- Decreased Memory, Fainting, Headaches, Numbness, Seizures, Tingling, Tremor, Trouble walking and Weakness. Psychiatric Present- Bipolar. Not Present- Anxiety, Change in Sleep Pattern, Depression, Fearful and Frequent crying. Endocrine Not Present- Cold Intolerance, Excessive Hunger, Hair Changes, Heat Intolerance, Hot flashes and New Diabetes. Hematology Not Present- Easy Bruising, Excessive bleeding, Gland problems, HIV and Persistent Infections.   Vitals Jerry Dec [facility] LPN; 4/33/2951 88:41 AM) 11/26/2014 10:35 AM Weight: 241.5 lb Height: 70in Body Surface Area: 2.33 m Body Mass Index: 34.65 kg/m Pulse: 82 (Regular)  BP: 130/78 (Sitting, Left Arm, Standard)    Physical Exam Jerry Filler MD; 11/26/2014 11:08 AM) General Mental Status-Alert. General Appearance-Consistent with stated age. Hydration-Well hydrated. Voice-Normal.  Head and Neck Head-normocephalic, atraumatic with no lesions or palpable masses. Trachea-midline. Thyroid Gland Characteristics - normal size and consistency.  Chest and Lung Exam Chest and lung exam reveals -quiet, even and easy respiratory effort with no use of accessory muscles and on auscultation, normal breath sounds, no adventitious sounds and normal vocal resonance. Inspection Chest Wall -  Normal. Back - normal.  Cardiovascular Cardiovascular examination reveals -normal heart sounds, regular rate and rhythm with no murmurs and normal pedal pulses bilaterally.  Abdomen Inspection Skin - Scar - no surgical scars. Hernias - Inguinal hernia - Right - Incarcerated  Note: Large incarcerated right inguinal hernia descending into the scrotum. Palpation/Percussion Normal exam - Soft,  Non Tender, No Rebound tenderness, No Rigidity (guarding) and No hepatosplenomegaly. Auscultation Normal exam - Bowel sounds normal.    Assessment & Plan Jerry Frye(Jerry Obryant MD; 11/26/2014 11:10 AM) RIGHT INGUINAL HERNIA (550.90  K40.90) Impression: 44 year old male with a large right incarcerated inguinal hernia.  1. The patient would like to proceed to the operating room for an open right inguinal hernia repair with mesh. 2. All risks and benefits were discussed with the patient to generally include, but not limited to: infection, bleeding, damage to surrounding structures, acute and chronic nerve pain, and recurrence. Alternatives were offered and described. All questions were answered and the patient voiced understanding of the procedure and wishes to proceed at this point with hernia repair. 3. The patient currently states he is not smoked for approximately 3 months. I discussed with him the risks of recurrence and the need to stay away from tobacco products up until the time of surgery.

## 2014-12-15 NOTE — Op Note (Signed)
12/15/2014  12:55 PM  PATIENT:  Jerry Frye  44 y.o. male  PRE-OPERATIVE DIAGNOSIS:  Right Inguinal Hernia  POST-OPERATIVE DIAGNOSIS:  Large Right indirect Inguinal Hernia  PROCEDURE:  Procedure(s): RIGHT INGUINAL HERNIA REPAIR WITH MESH (Right)  SURGEON:  Surgeon(s) and Role:    * Axel FillerArmando Reilley Valentine, MD - Primary  ANESTHESIA:   local, regional and general  EBL: 15cc  Total I/O In: 1500 [I.V.:1500] Out: 150 [Urine:150]  BLOOD ADMINISTERED:none  DRAINS: none   LOCAL MEDICATIONS USED:  BUPIVICAINE   SPECIMEN:  Source of Specimen:  Hernia sac  DISPOSITION OF SPECIMEN:  PATHOLOGY  COUNTS:  YES  TOURNIQUET:  * No tourniquets in log *  DICTATION: .Dragon Dictation Details of the procedure: The patient was taken back to the operating room. The patient was placed in supine position with bilateral SCDs in place. The patient was prepped and draped in the usual sterile fashion.  After appropriate anitbiotics were confirmed, a time-out was confirmed and all facts were verified.   A 5 cm incision was made just 1 cm superior to the inguinal ligament. Bovie cautery was used to maintain hemostasis dissection is carried down to the external oblique.  A standard incision was made laterally, and the external oblique was bluntly dissected away from the surrounding tissue with Metzenbaum scissors. The external oblique was elevated in the spermatic cord was bluntly dissected away from the surrounding tissue.  The ilioinguinal nerve was  Identified and dissected proximally.  It was ligated with 2-0 vicryl and transected.  The hernia sac was seen to be very large and contain omentum. The spermatic cord and the hernia were then bluntly dissected away from the pubic tubercle and a Penrose was placed around the hernia sac in the spermatic cord.   The vas deferens was identified and protected at all portions of the case. Dissection of the cremasterics took place with Bovie cautery. One the hernia sac  was dissected away from the surrrounding cremesteric tissue , the hernia sac was entered laterally. There was omentum that was reduced at the base, there were no sliding components and no femoral hernia palpated. The hernia sac was highly ligated using 0 Vicryl.  The specimen was sent to pathology. This retracted into the abdomen in the usual fashion.    At this time a right sided Progrip mesh was then anchored to the pubic tubercle with a 2-0 Prolene.  It was anchored to the shelving edge of the external oblique x 1 and the conjoint tendon cephalad x 1.  The wrap around of the mesh was sutured to the conjoint tendon as well.  The new internal ring did not strangulate the spermatic cord.   The tail was then tucked under the external oblique. At this time the area was irrigated out with sterile saline.  The external oblique was reapproximated using a 2-0 Vicryl in a running fashion. Scarpa's fascia was then reapproximated using a 3-0 Vicryl running fashion. The skin was then reapproximated with 4 Monocryl in a subcuticular fashion. The skin was then dressed with Dermabond.  The patient was taken to the recovery room in stable condition.   PLAN OF CARE: Discharge to home after PACU  PATIENT DISPOSITION:  PACU - hemodynamically stable.   Delay start of Pharmacological VTE agent (>24hrs) due to surgical blood loss or risk of bleeding: not applicable

## 2014-12-15 NOTE — Progress Notes (Signed)
Pt says he was not aware that someone needs to be with him for the first 24 hours post-op. His sister will take him home, but he says there is absolutely no one who can stay with him. MD notified.

## 2014-12-15 NOTE — Anesthesia Preprocedure Evaluation (Addendum)
Anesthesia Evaluation  Patient identified by MRN, date of birth, ID band Patient awake    Reviewed: Allergy & Precautions, H&P , NPO status , Patient's Chart, lab work & pertinent test results  Airway Mallampati: II  TM Distance: >3 FB Neck ROM: Full    Dental no notable dental hx. (+) Teeth Intact, Dental Advisory Given   Pulmonary neg pulmonary ROS, former smoker,  breath sounds clear to auscultation  Pulmonary exam normal       Cardiovascular negative cardio ROS  Rhythm:Regular Rate:Normal     Neuro/Psych Schizophrenia negative neurological ROS     GI/Hepatic negative GI ROS, Neg liver ROS,   Endo/Other  negative endocrine ROS  Renal/GU negative Renal ROS  negative genitourinary   Musculoskeletal   Abdominal   Peds  Hematology negative hematology ROS (+)   Anesthesia Other Findings   Reproductive/Obstetrics negative OB ROS                            Anesthesia Physical Anesthesia Plan  ASA: II  Anesthesia Plan: General and Regional   Post-op Pain Management:    Induction: Intravenous  Airway Management Planned: Oral ETT  Additional Equipment:   Intra-op Plan:   Post-operative Plan: Extubation in OR  Informed Consent: I have reviewed the patients History and Physical, chart, labs and discussed the procedure including the risks, benefits and alternatives for the proposed anesthesia with the patient or authorized representative who has indicated his/her understanding and acceptance.   Dental advisory given  Plan Discussed with: CRNA  Anesthesia Plan Comments:        Anesthesia Quick Evaluation

## 2014-12-15 NOTE — Anesthesia Procedure Notes (Addendum)
Anesthesia Regional Block:  TAP block  Pre-Anesthetic Checklist: ,, timeout performed, Correct Patient, Correct Site, Correct Laterality, Correct Procedure, Correct Position, site marked, Risks and benefits discussed, pre-op evaluation, post-op pain management  Laterality: Right  Prep: Maximum Sterile Barrier Precautions used and chloraprep       Needles:  Injection technique: Single-shot  Needle Type: Echogenic Stimulator Needle     Needle Length: 9cm 9 cm Needle Gauge: 21 and 21 G    Additional Needles:  Procedures: ultrasound guided (picture in chart) TAP block Narrative:  Start time: 12/15/2014 10:20 AM End time: 12/15/2014 10:30 AM Injection made incrementally with aspirations every 5 mL. Anesthesiologist: Gaynelle AduFITZGERALD, WILLIAM  Additional Notes: 2% Lidocaine skin wheel.   Procedure Name: Intubation Date/Time: 12/15/2014 11:19 AM Performed by: Roney MansSMITH, Elonna Mcfarlane P Pre-anesthesia Checklist: Patient identified, Timeout performed, Emergency Drugs available, Suction available and Patient being monitored Patient Re-evaluated:Patient Re-evaluated prior to inductionOxygen Delivery Method: Circle system utilized Preoxygenation: Pre-oxygenation with 100% oxygen Intubation Type: IV induction Ventilation: Mask ventilation without difficulty Laryngoscope Size: Mac and 4 Grade View: Grade I Tube type: Oral Tube size: 7.5 mm Number of attempts: 1 Airway Equipment and Method: Stylet and Bite block Placement Confirmation: ETT inserted through vocal cords under direct vision,  breath sounds checked- equal and bilateral and positive ETCO2 Secured at: 23 cm Tube secured with: Tape Dental Injury: Teeth and Oropharynx as per pre-operative assessment

## 2014-12-15 NOTE — Discharge Instructions (Signed)
[facility] _______[facility] ° °INGUINAL HERNIA REPAIR: POST OP INSTRUCTIONS ° °Always review your discharge instruction sheet given to you by the facility where your surgery was performed. °IF YOU HAVE DISABILITY OR FAMILY LEAVE FORMS, YOU MUST BRING THEM TO THE OFFICE FOR PROCESSING.   °DO NOT GIVE THEM TO YOUR DOCTOR. ° °1. A  prescription for pain medication may be given to you upon discharge.  Take your pain medication as prescribed, if needed.  If narcotic pain medicine is not needed, then you may take acetaminophen (Tylenol) or ibuprofen (Advil) as needed. °2. Take your usually prescribed medications unless otherwise directed. °3. If you need a refill on your pain medication, please contact your pharmacy.  They will contact our office to request authorization. Prescriptions will not be filled after 5 pm or on week-ends. °4. You should follow a light diet the first 24 hours after arrival home, such as soup and crackers, etc.  Be sure to include lots of fluids daily.  Resume your normal diet the day after surgery. °5. Most patients will experience some swelling and bruising around the umbilicus or in the groin and scrotum.  Ice packs and reclining will help.  Swelling and bruising can take several days to resolve.  °6. It is common to experience some constipation if taking pain medication after surgery.  Increasing fluid intake and taking a stool softener (such as Colace) will usually help or prevent this problem from occurring.  A mild laxative (Milk of Magnesia or Miralax) should be taken according to package directions if there are no bowel movements after 48 hours. °7. Unless discharge instructions indicate otherwise, you may remove your bandages 24-48 hours after surgery, and you may shower at that time.  You may have steri-strips (small skin tapes) in place directly over the incision.  These strips should be left on the skin for 7-10 days.  If your surgeon used skin glue on the incision, you  may shower in 24 hours.  The glue will flake off over the next 2-3 weeks.  Any sutures or staples will be removed at the office during your follow-up visit. °8. ACTIVITIES:  You may resume regular (light) daily activities beginning the next day--such as daily self-care, walking, climbing stairs--gradually increasing activities as tolerated.  You may have sexual intercourse when it is comfortable.  Refrain from any heavy lifting or straining until approved by your doctor. °a. You may drive when you are no longer taking prescription pain medication, you can comfortably wear a seatbelt, and you can safely maneuver your car and apply brakes. °b. RETURN TO WORK:  __________________________________________________________ °9. You should see your doctor in the office for a follow-up appointment approximately 2-3 weeks after your surgery.  Make sure that you call for this appointment within a day or two after you arrive home to insure a convenient appointment time. °10. OTHER INSTRUCTIONS:  __________________________________________________________________________________________________________________________________________________________________________________________  °WHEN TO CALL YOUR DOCTOR: °1. Fever over 101.0 °2. Inability to urinate °3. Nausea and/or vomiting °4. Extreme swelling or bruising °5. Continued bleeding from incision. °6. Increased pain, redness, or drainage from the incision ° °The clinic staff is available to answer your questions during regular business hours.  Please don’t hesitate to call and ask to speak to one of the nurses for clinical concerns.  If you have a medical emergency, go to the nearest emergency room or call 911.  A surgeon from [facility] is always on call at the hospital ° ° °[address], [address], [place], [place]  [address] ? ° [address] [address], [place], [place]   [address] °[phone] ? [phone] ? FAX [phone] °Web site:  [email or web address] ° °

## 2014-12-16 ENCOUNTER — Encounter (HOSPITAL_COMMUNITY): Payer: Self-pay | Admitting: General Practice

## 2014-12-16 DIAGNOSIS — K4090 Unilateral inguinal hernia, without obstruction or gangrene, not specified as recurrent: Secondary | ICD-10-CM | POA: Diagnosis not present

## 2014-12-16 NOTE — Discharge Summary (Signed)
Physician Discharge Summary  Patient ID: Jerry Frye MRN: [id number] DOB/AGE: [birth year] 44 y.o.  Admit date: 12/15/2014 Discharge date: 12/16/2014  [facility]  Discharge Diagnoses:  Active Problems:   S/P hernia surgery   Discharged Condition: good  Hospital Course: Pt was admitted post op.  Pain was well controlled.  He was tol a reg meal.  He was ambulating well on his own without any difficulties.  He was afebrile, deemed stable for DC and Dc'd home.  Consults: None  Significant Diagnostic Studies: none  Treatments: surgery: as above  Discharge Exam: Blood pressure 119/76, pulse 88, temperature 98 F (36.7 C), temperature source Oral, resp. rate 17, height 5' 10.25" (1.784 m), weight 106.595 kg (235 lb), SpO2 96 %. General appearance: alert and cooperative  Abd soft, approp ttp, ND, wound c/d/i  Disposition: [facility]-Another Health Care Institution Not Defined  Discharge Instructions    Diet - low sodium heart healthy    Complete by:  As directed      Increase activity slowly    Complete by:  As directed             Medication List    TAKE these medications        benztropine 1 MG tablet  Commonly known as:  COGENTIN  Take 1 tablet (1 mg total) by mouth 2 (two) times daily.     divalproex 500 MG DR tablet  Commonly known as:  DEPAKOTE  Take 1 tablet (500 mg total) by mouth 2 (two) times daily after a meal.     oxyCODONE-acetaminophen 7.5-325 MG per tablet  Commonly known as:  PERCOCET  Take 1 tablet by mouth every 4 (four) hours as needed for severe pain.     ziprasidone 60 MG capsule  Commonly known as:  GEODON  Take 1 capsule (60 mg total) by mouth 2 (two) times daily with a meal.           Follow-up Information    Follow up with Lajean Saveramirez Jr., Markies Mowatt, MD. Schedule an appointment as soon as possible for a visit in 2 weeks.   Specialty:  General Surgery   Why:  For wound re-check   Contact information:   [address] [place] [id number] [phone]       Signed: Marigene EhlersRamirez Jr., [facility] 12/16/2014, 7:50 AM

## 2014-12-16 NOTE — Progress Notes (Signed)
Discussed discharge summary with patient. Reviewed all medications with patient. Patient received Rx. Patient ready for discharge. 

## 2014-12-16 NOTE — Progress Notes (Signed)
1 Day Post-Op  Subjective: DOing well.  Tol PO.  amulating well  Objective: Vital signs in last 24 hours: Temp:  [97.9 F (36.6 C)-98.4 F (36.9 C)] 98 F (36.7 C) (06/01 0541) Pulse Rate:  [66-96] 88 (06/01 0541) Resp:  [10-18] 17 (06/01 0541) BP: (100-138)/(54-117) 119/76 mmHg (06/01 0541) SpO2:  [91 %-99 %] 96 % (06/01 0541) Weight:  [106.595 kg (235 lb)] 106.595 kg (235 lb) (05/31 0908) Last BM Date: 12/14/14  Intake/Output from previous day: 05/31 0701 - 06/01 0700 In: 1500 [I.V.:1500] Out: 150 [Urine:150] Intake/Output this shift:    General appearance: alert and cooperative GI: soft, non-tender; bowel sounds normal; no masses,  no organomegaly and wound c/d/i  Lab Results:   Recent Labs  12/15/14 0908  WBC 8.2  HGB 15.8  HCT 44.4  PLT 346   BMET No results for input(s): NA, K, CL, CO2, GLUCOSE, BUN, CREATININE, CALCIUM in the last 72 hours. PT/INR No results for input(s): LABPROT, INR in the last 72 hours. ABG No results for input(s): PHART, HCO3 in the last 72 hours.  Invalid input(s): PCO2, PO2  Studies/Results: No results found.  Anti-infectives: Anti-infectives    Start     Dose/Rate Route Frequency Ordered Stop   12/15/14 1015  vancomycin (VANCOCIN) IVPB 1000 mg/200 mL premix    Comments:  On call to OR   1,000 mg 200 mL/hr over 60 Minutes Intravenous  Once 12/15/14 1008 12/15/14 1145      Assessment/Plan: s/p Procedure(s): RIGHT INGUINAL HERNIA REPAIR WITH MESH (Right) Discharge     Marigene Ehlersamirez Jr., Jed LimerickArmando 12/16/2014

## 2016-04-02 ENCOUNTER — Encounter (HOSPITAL_COMMUNITY): Payer: Self-pay | Admitting: *Deleted

## 2016-04-02 ENCOUNTER — Emergency Department (HOSPITAL_COMMUNITY)
Admission: EM | Admit: 2016-04-02 | Discharge: 2016-04-05 | Disposition: A | Discharge status: Other Acute Inpatient Hospital | Payer: Medicaid Other | Attending: Emergency Medicine | Admitting: Emergency Medicine

## 2016-04-02 DIAGNOSIS — Z5181 Encounter for therapeutic drug level monitoring: Secondary | ICD-10-CM | POA: Insufficient documentation

## 2016-04-02 DIAGNOSIS — R45850 Homicidal ideations: Secondary | ICD-10-CM

## 2016-04-02 DIAGNOSIS — F250 Schizoaffective disorder, bipolar type: Secondary | ICD-10-CM | POA: Diagnosis present

## 2016-04-02 DIAGNOSIS — Z87891 Personal history of nicotine dependence: Secondary | ICD-10-CM | POA: Insufficient documentation

## 2016-04-02 LAB — CBC WITH DIFFERENTIAL/PLATELET
Basophils Absolute: 0 10*3/uL (ref 0.0–0.1)
Basophils Relative: 1 %
Eosinophils Absolute: 0 10*3/uL (ref 0.0–0.7)
Eosinophils Relative: 0 %
HCT: 45.1 % (ref 39.0–52.0)
Hemoglobin: 15.7 g/dL (ref 13.0–17.0)
Lymphocytes Relative: 27 %
Lymphs Abs: 2.3 10*3/uL (ref 0.7–4.0)
MCH: 31.9 pg (ref 26.0–34.0)
MCHC: 34.8 g/dL (ref 30.0–36.0)
MCV: 91.7 fL (ref 78.0–100.0)
Monocytes Absolute: 0.6 10*3/uL (ref 0.1–1.0)
Monocytes Relative: 6 %
Neutro Abs: 5.8 10*3/uL (ref 1.7–7.7)
Neutrophils Relative %: 66 %
Platelets: 450 10*3/uL — ABNORMAL HIGH (ref 150–400)
RBC: 4.92 MIL/uL (ref 4.22–5.81)
RDW: 12.4 % (ref 11.5–15.5)
WBC: 8.8 10*3/uL (ref 4.0–10.5)

## 2016-04-02 LAB — COMPREHENSIVE METABOLIC PANEL
ALT: 34 U/L (ref 17–63)
AST: 32 U/L (ref 15–41)
Albumin: 4.3 g/dL (ref 3.5–5.0)
Alkaline Phosphatase: 47 U/L (ref 38–126)
Anion gap: 10 (ref 5–15)
BUN: 8 mg/dL (ref 6–20)
CO2: 23 mmol/L (ref 22–32)
Calcium: 9.3 mg/dL (ref 8.9–10.3)
Chloride: 108 mmol/L (ref 101–111)
Creatinine, Ser: 1 mg/dL (ref 0.61–1.24)
GFR calc Af Amer: >60 mL/min (ref >60)
GFR calc non Af Amer: >60 mL/min (ref >60)
Glucose, Bld: 166 mg/dL — ABNORMAL HIGH (ref 65–99)
Potassium: 3.3 mmol/L — ABNORMAL LOW (ref 3.5–5.1)
Sodium: 141 mmol/L (ref 135–145)
Total Bilirubin: 0.7 mg/dL (ref 0.3–1.2)
Total Protein: 7.6 g/dL (ref 6.5–8.1)

## 2016-04-02 LAB — RAPID URINE DRUG SCREEN, HOSP PERFORMED
Amphetamines: NOT DETECTED
Barbiturates: NOT DETECTED
Benzodiazepines: NOT DETECTED
Cocaine: NOT DETECTED
Opiates: NOT DETECTED
Tetrahydrocannabinol: NOT DETECTED

## 2016-04-02 LAB — ETHANOL: Alcohol, Ethyl (B): <5 mg/dL (ref <5)

## 2016-04-02 NOTE — ED Notes (Signed)
Report to include Situation, Background, Assessment, and Recommendations received from Joy RN. Patient alert and oriented, warm and dry, in no acute distress. Patient denies SI, HI, AVH and pain. Patient made aware of Q15 minute rounds and security cameras for their safety. Patient instructed to come to me with needs or concerns. 
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
## 2016-04-02 NOTE — ED Notes (Signed)
Pt belongings placed in hallway locker due to facilities removing lock from locker #36.

## 2016-04-02 NOTE — ED Notes (Signed)
Patient noted in room. No complaints, stable, in no acute distress. Q15 minute rounds and monitoring via Security Cameras to continue.  

## 2016-04-02 NOTE — ED Notes (Signed)
Patient report given to Gary Leduc RN 
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
## 2016-04-02 NOTE — ED Notes (Signed)
Patient noted sleeping in room. No complaints, stable, in no acute distress. Q15 minute rounds and monitoring via Security Cameras to continue.  

## 2016-04-02 NOTE — BH Assessment (Signed)
[facility] Assessment Progress Note  Case was staffed with Rankin FNP who recommended an inpatient admission.
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
## 2016-04-03 DIAGNOSIS — R45850 Homicidal ideations: Secondary | ICD-10-CM

## 2016-04-03 DIAGNOSIS — F250 Schizoaffective disorder, bipolar type: Secondary | ICD-10-CM

## 2016-04-03 MED ORDER — OLANZAPINE 10 MG PO TBDP: 10.0000 mg | ORAL_TABLET | Freq: Three times a day (TID) | ORAL | Status: DC | PRN

## 2016-04-03 MED ORDER — TRAZODONE HCL 100 MG PO TABS
100.0000 mg | ORAL_TABLET | Freq: Every day | ORAL | Status: DC
  Administered 2016-04-03 – 2016-04-04 (×2): 100 mg via ORAL
  Filled 2016-04-03 (×2): qty 1

## 2016-04-03 MED ORDER — BENZTROPINE MESYLATE 1 MG PO TABS
1.0000 mg | ORAL_TABLET | Freq: Two times a day (BID) | ORAL | Status: DC
  Administered 2016-04-03 – 2016-04-05 (×4): 1 mg via ORAL
  Filled 2016-04-03 (×5): qty 1

## 2016-04-03 MED ORDER — ZIPRASIDONE HCL 20 MG PO CAPS
60.0000 mg | ORAL_CAPSULE | Freq: Two times a day (BID) | ORAL | Status: DC
  Administered 2016-04-03 – 2016-04-05 (×4): 60 mg via ORAL
  Filled 2016-04-03 (×4): qty 3

## 2016-04-03 MED ORDER — DIVALPROEX SODIUM 500 MG PO DR TAB
500.0000 mg | DELAYED_RELEASE_TABLET | Freq: Two times a day (BID) | ORAL | Status: DC
  Administered 2016-04-03 – 2016-04-05 (×4): 500 mg via ORAL
  Filled 2016-04-03 (×4): qty 1

## 2016-04-03 NOTE — ED Notes (Signed)
Patient noted sleeping in room. No complaints, stable, in no acute distress. Q15 minute rounds and monitoring via Security Cameras to continue.  

## 2016-04-03 NOTE — ED Notes (Signed)
Pt sleeping at present, no distress noted, calm & cooperative, monitoring for safety, Q 15 min checks in effect. 

## 2016-04-03 NOTE — Progress Notes (Signed)
04/03/16 1407:  LRT went to pt room to offer activities.  Pt was lying down but was awake.  Pt stated he was not interested in activities at this time.  [name] Lindel Marcell, LRT/CTRS

## 2016-04-03 NOTE — ED Notes (Signed)
Pt said this morning, "They are letting me go home today."  His affect was too bright and his tone of voice was loud." He said that he still wants to kill he sister-in-law and called her a "bitch". He has been cooperative and calm, staying in his room all morning.

## 2016-04-03 NOTE — ED Notes (Signed)
Pt states that he will start eating now and he wanted his TV turned on. He is also agreeing to take medications, but he did ask an invisible person in the room for permission or advise about the medication.

## 2016-04-03 NOTE — Consult Note (Signed)
[place] [facility] Consult   Reason for Consult:  Mania  Referring Physician:  EDP Patient Identification: Jerry Frye MRN:  [id number] Principal Diagnosis: Schizoaffective disorder, bipolar type [facility]) Diagnosis:   Patient Active Problem List   Diagnosis Date Noted  . Schizoaffective disorder, bipolar type ([place]) [F25.0] 04/23/2013    Priority: High  . S/P hernia surgery [U20.254, Z87.19] 12/15/2014  . Cannabis abuse [F12.10] 04/29/2013    Total Time spent with patient: 45 minutes  Subjective:   Jerry Frye is a 45 y.o. male patient admitted with mania.  HPI:  On admission:  45 y.o. male that presents this date under IVC. Patient presents with a very agitated affect and is pressured in his speech. Patient is tangential but can be redirected. Patient admits to current H/I with a plan to kill his brother's wife. Patient would not elaborate stating "he ain't able to tell that secret" but then stated he was going to "chop her head off." Patient is very animated and is moving his hands in circles and  speaks incoherently at times, laughing uncontrollably. Patient is a poor historian although denies any S/I or VH but does state he hears "the lord and devil speaking to him." Patient would not elaborate stating "it's all a secret plan." Patient denies being on any medications but states he receives services from [facility] but has not been there "in some time.". Per IVC: "Respondent has a long history of mental illness. The respondent hears Jesus and the Devil telling him to do bad things. Respondent sent a text message to brother's wife this date threatening to kill her in front of him". Admission note stated: " Patient has a hx of schizoaffective disorder who presents to the Emergency Department for medical clearance. Pt states that he was brought in for threatening to kill his sister-in-law. He says that they are [place] worshippers and white supremacists. He says that they bring  "representatives" to his house. He told his brother that if he brought his wife around, he would "cut her fucking head off." Pt states that he does not have access to weapons, but would definitely go through with his plans. He reports that he has hurt and killed many people in the past. He will not divulge further details. Pt reports occasional use of marijuana. He does not take any prescription medications. He denies recent fever, chills, nausea, vomiting, visual or auditory hallucinations". Per notes patient was last admitted to [facility] in 2014. Case was staffed with [place] FNP who recommended an inpatient admission.  Today, the patient is hypomanic and doing yoga on his bed.  He does report his wife is a "bitch ass satan worshiper."  Fortunately, his wife does not live with him as he would like to kill satan.  Denies suicidal ideations and alcohol/abuse.  Pressured speech, no insight.  Past Psychiatric History: bipolar disorder  Risk to Self: Suicidal Ideation: No Suicidal Intent: No Is patient at risk for suicide?: No Suicidal Plan?: No Access to Means: No What has been your use of drugs/alcohol within the last 12 months?: Denies How many times?: 0 (per patient) Other Self Harm Risks: na Triggers for Past Attempts: Unknown Intentional Self Injurious Behavior: None Risk to Others: Homicidal Ideation: Yes-Currently Present Thoughts of Harm to Others: Yes-Currently Present Comment - Thoughts of Harm to Others: thoughts of killing sister in law Current Homicidal Intent: Yes-Currently Present Current Homicidal Plan: Yes-Currently Present Describe Current Homicidal Plan: pt plans to "chop her head off" Access to  Homicidal Means: Yes Describe Access to Homicidal Means: pt states they have a axe Identified Victim: sister in law History of harm to others?: Yes Assessment of Violence: None Noted Violent Behavior Description: na Does patient have access to weapons?: No Criminal Charges Pending?:  No Does patient have a court date: No Prior Inpatient Therapy: Prior Inpatient Therapy: Yes Prior Therapy Dates: 2014 Prior Therapy Facilty/Provider(s): [facility] Reason for Treatment: MH issues Prior Outpatient Therapy: Prior Outpatient Therapy: Yes Prior Therapy Dates: 2017 Prior Therapy Facilty/Provider(s): [facility] Reason for Treatment: MH issues Does patient have an ACCT team?: No Does patient have Intensive In-House Services?  : No Does patient have [facility] services? : No Does patient have P4CC services?: No  Past Medical History:  Past Medical History:  Diagnosis Date  . Schizophrenic disorder [facility])     Past Surgical History:  Procedure Laterality Date  . INGUINAL HERNIA REPAIR Right 12/15/2014  . INGUINAL HERNIA REPAIR Right 12/15/2014   Procedure: RIGHT INGUINAL HERNIA REPAIR WITH MESH;  Surgeon: Ralene Ok, MD;  Location: [place];  Service: General;  Laterality: Right;  . NO PAST SURGERIES     Family History:  Family History  Problem Relation Age of Onset  . Family history unknown: Yes   Family Psychiatric  History: none Social History:  History  Alcohol Use  . Yes    Comment: occ     History  Drug Use No    Social History   Social History  . Marital status: Divorced    Spouse name: N/A  . Number of children: N/A  . Years of education: N/A   Social History Main Topics  . Smoking status: Former Research scientist (life sciences)  . Smokeless tobacco: Never Used  . Alcohol use Yes     Comment: occ  . Drug use: No  . Sexual activity: Not Asked   Other Topics Concern  . None   Social History Narrative  . None   Additional Social History:    Allergies:   Allergies  Allergen Reactions  . Ciprofloxacin Anaphylaxis  . Penicillins Anaphylaxis    Has patient had a PCN reaction causing immediate rash, facial/tongue/throat swelling, SOB or lightheadedness with hypotension: yes Has patient had a PCN reaction causing severe rash involving mucus membranes or skin necrosis: no Has  patient had a PCN reaction that required hospitalization : unknown Has patient had a PCN reaction occurring within the last 10 years: yes If all of the above answers are "NO", then may proceed with Cephalosporin use.     Labs:  Results for orders placed or performed during the hospital encounter of 04/02/16 (from the past 48 hour(s))  Urine rapid drug screen (hosp performed)not at [facility]     Status: None   Collection Time: 04/02/16  4:03 PM  Result Value Ref Range   Opiates NONE DETECTED NONE DETECTED   Cocaine NONE DETECTED NONE DETECTED   Benzodiazepines NONE DETECTED NONE DETECTED   Amphetamines NONE DETECTED NONE DETECTED   Tetrahydrocannabinol NONE DETECTED NONE DETECTED   Barbiturates NONE DETECTED NONE DETECTED    Comment:        DRUG SCREEN FOR MEDICAL PURPOSES ONLY.  IF CONFIRMATION IS NEEDED FOR ANY PURPOSE, NOTIFY LAB WITHIN 5 DAYS.        LOWEST DETECTABLE LIMITS FOR URINE DRUG SCREEN Drug Class       Cutoff (ng/mL) Amphetamine      1000 Barbiturate      200 Benzodiazepine   176 Tricyclics  300 Opiates          300 Cocaine          300 THC              50   Comprehensive metabolic panel     Status: Abnormal   Collection Time: 04/02/16  4:20 PM  Result Value Ref Range   Sodium 141 135 - 145 mmol/L   Potassium 3.3 (L) 3.5 - 5.1 mmol/L   Chloride 108 101 - 111 mmol/L   CO2 23 22 - 32 mmol/L   Glucose, Bld 166 (H) 65 - 99 mg/dL   BUN 8 6 - 20 mg/dL   Creatinine, Ser 1.00 0.61 - 1.24 mg/dL   Calcium 9.3 8.9 - 10.3 mg/dL   Total Protein 7.6 6.5 - 8.1 g/dL   Albumin 4.3 3.5 - 5.0 g/dL   AST 32 15 - 41 U/L   ALT 34 17 - 63 U/L   Alkaline Phosphatase 47 38 - 126 U/L   Total Bilirubin 0.7 0.3 - 1.2 mg/dL   GFR calc non Af Amer >60 >60 mL/min   GFR calc Af Amer >60 >60 mL/min    Comment: (NOTE) The eGFR has been calculated using the CKD EPI equation. This calculation has not been validated in all clinical situations. eGFR's persistently <60 mL/min signify  possible Chronic Kidney Disease.    Anion gap 10 5 - 15  Ethanol     Status: None   Collection Time: 04/02/16  4:20 PM  Result Value Ref Range   Alcohol, Ethyl (B) <5 <5 mg/dL    Comment:        LOWEST DETECTABLE LIMIT FOR SERUM ALCOHOL IS 5 mg/dL FOR MEDICAL PURPOSES ONLY   CBC with Diff     Status: Abnormal   Collection Time: 04/02/16  4:20 PM  Result Value Ref Range   WBC 8.8 4.0 - 10.5 K/uL   RBC 4.92 4.22 - 5.81 MIL/uL   Hemoglobin 15.7 13.0 - 17.0 g/dL   HCT 45.1 39.0 - 52.0 %   MCV 91.7 78.0 - 100.0 fL   MCH 31.9 26.0 - 34.0 pg   MCHC 34.8 30.0 - 36.0 g/dL   RDW 12.4 11.5 - 15.5 %   Platelets 450 (H) 150 - 400 K/uL   Neutrophils Relative % 66 %   Neutro Abs 5.8 1.7 - 7.7 K/uL   Lymphocytes Relative 27 %   Lymphs Abs 2.3 0.7 - 4.0 K/uL   Monocytes Relative 6 %   Monocytes Absolute 0.6 0.1 - 1.0 K/uL   Eosinophils Relative 0 %   Eosinophils Absolute 0.0 0.0 - 0.7 K/uL   Basophils Relative 1 %   Basophils Absolute 0.0 0.0 - 0.1 K/uL    Current Facility-Administered Medications  Medication Dose Route Frequency Provider Last Rate Last Dose  . benztropine (COGENTIN) tablet 1 mg  1 mg Oral BID  , MD      . divalproex (DEPAKOTE) DR tablet 500 mg  500 mg Oral BID PC  , MD      . OLANZapine zydis (ZYPREXA) disintegrating tablet 10 mg  10 mg Oral Q8H PRN  , MD      . traZODone (DESYREL) tablet 100 mg  100 mg Oral QHS  , MD      . ziprasidone (GEODON) capsule 60 mg  60 mg Oral BID WC Corena Pilgrim, MD       Current Outpatient Prescriptions  Medication Sig Dispense Refill  . benztropine (  COGENTIN) 1 MG tablet Take 1 tablet (1 mg total) by mouth 2 (two) times daily. (Patient not taking: Reported on 04/02/2016) 60 tablet 0  . divalproex (DEPAKOTE) 500 MG DR tablet Take 1 tablet (500 mg total) by mouth 2 (two) times daily after a meal. (Patient not taking: Reported on 04/02/2016) 60 tablet 0  . oxyCODONE-acetaminophen  (PERCOCET) 7.5-325 MG per tablet Take 1 tablet by mouth every 4 (four) hours as needed for severe pain. (Patient not taking: Reported on 04/02/2016) 30 tablet 0  . ziprasidone (GEODON) 60 MG capsule Take 1 capsule (60 mg total) by mouth 2 (two) times daily with a meal. (Patient not taking: Reported on 04/02/2016) 60 capsule 0    Musculoskeletal: Strength & Muscle Tone: within normal limits Gait & Station: normal Patient leans: N/A  Psychiatric Specialty Exam: Physical Exam  Constitutional: He is oriented to person, place, and time. He appears well-developed and well-nourished.  HENT:  Head: Normocephalic.  Neck: Normal range of motion.  Respiratory: Effort normal.  Musculoskeletal: Normal range of motion.  Neurological: He is alert and oriented to person, place, and time.  Skin: Skin is warm and dry.  Psychiatric: His mood appears anxious. His speech is rapid and/or pressured. He is actively hallucinating. Thought content is delusional. Cognition and memory are impaired. He expresses inappropriate judgment.    Review of Systems  Constitutional: Negative.   HENT: Negative.   Eyes: Negative.   Respiratory: Negative.   Cardiovascular: Negative.   Gastrointestinal: Negative.   Genitourinary: Negative.   Musculoskeletal: Negative.   Skin: Negative.   Neurological: Negative.   Endo/Heme/Allergies: Negative.   Psychiatric/Behavioral: Positive for hallucinations.    Blood pressure 126/76, pulse 88, temperature 97.9 F (36.6 C), temperature source Oral, resp. rate 17, SpO2 98 %.There is no height or weight on file to calculate BMI.  General Appearance: Casual  Eye Contact:  Fair  Speech:  Pressured  Volume:  Normal  Mood:  Anxious  Affect:  Congruent  Thought Process:  Descriptions of Associations: Tangential  Orientation:  Other:  person and place  Thought Content:  Delusions and Hallucinations: Auditory  Suicidal Thoughts:  No  Homicidal Thoughts:  Yes.  without intent/plan   Memory:  Immediate;   Fair Recent;   Poor Remote;   Fair  Judgement:  Impaired  Insight:  Lacking  Psychomotor Activity:  Increased  Concentration:  Concentration: Fair and Attention Span: Fair  Recall:  [employer] of Knowledge:  Fair  Language:  Good  Akathisia:  No  Handed:  Right  AIMS (if indicated):     Assets:  Leisure Time Physical Health Resilience Social Support  ADL's:  Intact  Cognition:  Impaired,  Mild  Sleep:        Treatment Plan Summary: Daily contact with patient to assess and evaluate symptoms and progress in treatment, Medication management and Plan bipolar affective disorder, 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  Started Trazodone 100 mg at bedtime for sleep and Zyprexa 10 mg TID PRN agitation.  Did not restart opiate medications. -Individual counseling  Disposition: Recommend psychiatric Inpatient admission when medically cleared.  Waylan Boga, NP 04/03/2016 12:27 PM  Patient seen face-to-face for psychiatric evaluation, chart reviewed and case discussed with the physician extender and developed treatment plan. Reviewed the information documented and agree with the treatment  plan. Corena Pilgrim, MD

## 2016-04-04 NOTE — BH Assessment (Signed)
[facility] Assessment Progress Note  Per Thedore MinsMojeed Akintayo, MD, this pt requires psychiatric hospitalization at this time.  Lillia AbedLindsay, RN, [facility]  Pt presents under IVC initiated by pt's father and upheld by Dr Jannifer FranklinAkintayo, and [facility].  Pt's nurse, Kendal Hymendie, has been notified, and agrees to call report to [phone].  Pt is to be transferred via law enforcement.  Doylene Canninghomas Doniven Vanpatten, MA Triage Specialist [phone]
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
## 2016-04-04 NOTE — ED Notes (Signed)
Pt is calm and cooperative today.  He is compliant with his medications.  Denies S/I and H/I at this time.  States "I'll just stay away from the people that cause problems"  He acts a little bizarre and childlike.

## 2016-04-04 NOTE — ED Notes (Signed)
Patient A/O, no noted distress. Denies pain. Expresses he will not need any thing and had a good day. Patient is back in the bed. Staff will continue to monitor, maintain safety, and meet needs.

## 2016-04-04 NOTE — BH Assessment (Signed)
[facility] Assessment Progress Note 04/04/16: Patient continues to meet inpatient criteria as appropriate bed placement is investigated.       

## 2016-04-04 NOTE — Progress Notes (Signed)
04/04/16 1358:  LRT went to offer pt activities, pt was sleep.  Jerry Frye, LRT/CTRS

## 2016-04-05 ENCOUNTER — Inpatient Hospital Stay (HOSPITAL_COMMUNITY)
Admission: AD | Admit: 2016-04-05 | Discharge: 2016-04-07 | DRG: 885 | Disposition: A | Discharge status: Home / Self Care | Payer: Medicaid Other | Attending: Psychiatry | Admitting: Psychiatry

## 2016-04-05 ENCOUNTER — Encounter (HOSPITAL_COMMUNITY): Payer: Self-pay | Admitting: *Deleted

## 2016-04-05 DIAGNOSIS — Z833 Family history of diabetes mellitus: Secondary | ICD-10-CM | POA: Diagnosis not present

## 2016-04-05 DIAGNOSIS — R45850 Homicidal ideations: Secondary | ICD-10-CM | POA: Diagnosis present

## 2016-04-05 DIAGNOSIS — F250 Schizoaffective disorder, bipolar type: Secondary | ICD-10-CM | POA: Diagnosis present

## 2016-04-05 DIAGNOSIS — Z818 Family history of other mental and behavioral disorders: Secondary | ICD-10-CM | POA: Diagnosis not present

## 2016-04-05 DIAGNOSIS — F1221 Cannabis dependence, in remission: Secondary | ICD-10-CM | POA: Diagnosis present

## 2016-04-05 DIAGNOSIS — Z8249 Family history of ischemic heart disease and other diseases of the circulatory system: Secondary | ICD-10-CM

## 2016-04-05 DIAGNOSIS — F17210 Nicotine dependence, cigarettes, uncomplicated: Secondary | ICD-10-CM

## 2016-04-05 DIAGNOSIS — Z79899 Other long term (current) drug therapy: Secondary | ICD-10-CM

## 2016-04-05 DIAGNOSIS — Z791 Long term (current) use of non-steroidal anti-inflammatories (NSAID): Secondary | ICD-10-CM

## 2016-04-05 DIAGNOSIS — F1290 Cannabis use, unspecified, uncomplicated: Secondary | ICD-10-CM

## 2016-04-05 DIAGNOSIS — F17200 Nicotine dependence, unspecified, uncomplicated: Secondary | ICD-10-CM | POA: Clinically undetermined

## 2016-04-05 MED ORDER — ALUM & MAG HYDROXIDE-SIMETH 200-200-20 MG/5ML PO SUSP: 30.0000 mL | ORAL | Status: DC | PRN

## 2016-04-05 MED ORDER — LORAZEPAM 1 MG PO TABS
1.0000 mg | ORAL_TABLET | Freq: Four times a day (QID) | ORAL | Status: DC | PRN
  Administered 2016-04-05: 1 mg via ORAL
  Filled 2016-04-05: qty 1

## 2016-04-05 MED ORDER — POTASSIUM CHLORIDE CRYS ER 20 MEQ PO TBCR
20.0000 meq | EXTENDED_RELEASE_TABLET | Freq: Two times a day (BID) | ORAL | Status: AC
  Administered 2016-04-05 – 2016-04-06 (×2): 20 meq via ORAL
  Filled 2016-04-05 (×4): qty 1

## 2016-04-05 MED ORDER — LORAZEPAM 2 MG/ML IJ SOLN: 1.0000 mg | Freq: Four times a day (QID) | INTRAMUSCULAR | Status: DC | PRN

## 2016-04-05 MED ORDER — DIPHENHYDRAMINE HCL 50 MG/ML IJ SOLN: 50.0000 mg | Freq: Four times a day (QID) | INTRAMUSCULAR | Status: DC | PRN

## 2016-04-05 MED ORDER — HALOPERIDOL LACTATE 5 MG/ML IJ SOLN: 5.0000 mg | Freq: Four times a day (QID) | INTRAMUSCULAR | Status: DC | PRN

## 2016-04-05 MED ORDER — OLANZAPINE 10 MG PO TBDP: 10.0000 mg | ORAL_TABLET | Freq: Three times a day (TID) | ORAL | Status: DC | PRN

## 2016-04-05 MED ORDER — BENZTROPINE MESYLATE 1 MG PO TABS
1.0000 mg | ORAL_TABLET | Freq: Two times a day (BID) | ORAL | Status: DC
  Administered 2016-04-05 – 2016-04-07 (×4): 1 mg via ORAL
  Filled 2016-04-05 (×10): qty 1

## 2016-04-05 MED ORDER — DIPHENHYDRAMINE HCL 25 MG PO CAPS: 50.0000 mg | ORAL_CAPSULE | Freq: Four times a day (QID) | ORAL | Status: DC | PRN

## 2016-04-05 MED ORDER — ACETAMINOPHEN 325 MG PO TABS: 650.0000 mg | ORAL_TABLET | Freq: Four times a day (QID) | ORAL | Status: DC | PRN

## 2016-04-05 MED ORDER — HALOPERIDOL 5 MG PO TABS: 5.0000 mg | ORAL_TABLET | Freq: Four times a day (QID) | ORAL | Status: DC | PRN

## 2016-04-05 MED ORDER — ZIPRASIDONE HCL 60 MG PO CAPS
60.0000 mg | ORAL_CAPSULE | Freq: Two times a day (BID) | ORAL | Status: DC
  Administered 2016-04-05 – 2016-04-07 (×4): 60 mg via ORAL
  Filled 2016-04-05: qty 3
  Filled 2016-04-05 (×8): qty 1
  Filled 2016-04-05: qty 3

## 2016-04-05 MED ORDER — TRAZODONE HCL 100 MG PO TABS
100.0000 mg | ORAL_TABLET | Freq: Every day | ORAL | Status: DC
  Administered 2016-04-05 – 2016-04-06 (×2): 100 mg via ORAL
  Filled 2016-04-05 (×5): qty 1

## 2016-04-05 MED ORDER — MAGNESIUM HYDROXIDE 400 MG/5ML PO SUSP: 30.0000 mL | Freq: Every day | ORAL | Status: DC | PRN

## 2016-04-05 MED ORDER — DIVALPROEX SODIUM 500 MG PO DR TAB
500.0000 mg | DELAYED_RELEASE_TABLET | Freq: Two times a day (BID) | ORAL | Status: DC
  Administered 2016-04-05 – 2016-04-07 (×4): 500 mg via ORAL
  Filled 2016-04-05 (×10): qty 1

## 2016-04-05 NOTE — BH Assessment (Signed)
Writer reassessed pt today. When writer entered room, pt was lying on his on bed with his back to door. Writer called pt's name and pt very quickly sat up and turned around. His affect was euphoric. He says that he slept well and has a good appetite. Pt says that he hasn't taken his psych meds in the last week "because I was preparing." He reports he works at [employer] and they were going to be busy so he was preparing. Pt has pressured, rapid speech. He denies SI and HI. He denies wanting to harm his sister in law. [facility] denies [facility].  Dr Jannifer FranklinAkintayo says pt continues to meet inpatient criteria. Pt is under IVC.  Evette Cristalaroline Paige Ival Pacer, [place] Therapeutic Triage Specialist

## 2016-04-05 NOTE — ED Notes (Signed)
Pt's affect is bright and he appears to be euphoric. When asked a questions he often consults with an invisible source: He will turn his head and face the wall, and then turn around and give an answer. He continues to want to harm his brother's wife and while he did not want to talk about it, he did say that she had said something to his children that she should not have said. He has two grown daughters and a 1-y/o grand-daugher. When leaving the room, this writer heard him talking to someone. He took his medication.
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
## 2016-04-05 NOTE — [facility] Suicide Risk Assessment (Signed)
[facility] INPATIENT:  Family/Significant Other Suicide Prevention Education  Suicide Prevention Education:  Education Completed; No one has been identified by the patient as the family member/significant other with whom the patient will be residing, and identified as the person(s) who will aid the patient in the event of a mental health crisis (suicidal ideations/suicide attempt).  With written consent from the patient, the family member/significant other has been provided the following suicide prevention education, prior to the and/or following the discharge of the patient.  The suicide prevention education provided includes the following:  Suicide risk factors  Suicide prevention and interventions  National Suicide Hotline telephone number  [facility] assessment telephone number  [facility] Emergency Assistance 911  [facility] and/or Residential Mobile Crisis Unit telephone number  Request made of family/significant other to:  Remove weapons (e.g., guns, rifles, knives), all items previously/currently identified as safety concern.    Remove drugs/medications (over-the-counter, prescriptions, illicit drugs), all items previously/currently identified as a safety concern.  The family member/significant other verbalizes understanding of the suicide prevention education information provided.  The family member/significant other agrees to remove the items of safety concern listed above. The patient did not endorse SI at the time of admission, nor did the patient c/o SI during the stay here.  SPE not required.   Jerry Frye Jerry Frye 04/05/2016, 1:51 PM

## 2016-04-05 NOTE — [facility] Group Notes (Signed)
[facility] LCSW Group Therapy  04/05/2016 2:31 PM   Type of Therapy:  Group Therapy   Participation Level:  Engaged  Participation Quality:  Attentive  Affect:  Appropriate   Cognitive:  Alert   Insight:  Engaged  Engagement in Therapy:  Improving   Modes of Intervention:  Education, Exploration, Socialization   Summary of Progress/Problems: Ramon Dredgedward joined the group  20 minutes late. Patient was engaged briefly and then left session early.    Onalee HuaDavid from [employer] was here to tell his story of recovery, inform patients about [facility] and play his guitar.   Baldo DaubJolan Lemya Greenwell 04/05/2016 2:31 PM

## 2016-04-05 NOTE — ED Notes (Signed)
[facility] Discharged instructions read to pt who verbalized understanding. All belongings returned to pt who signed for same. [facility]. He remained cooperative.

## 2016-04-05 NOTE — Progress Notes (Signed)
Adult Psychoeducational Group Note  Date:  04/05/2016 Time:  9:24 PM  Group Topic/Focus:  Wrap-Up Group:   The focus of this group is to help patients review their daily goal of treatment and discuss progress on daily workbooks.   Participation Level:  Active  Participation Quality:  Appropriate  Affect:  Appropriate  Cognitive:  Appropriate  Insight: Appropriate  Engagement in Group:  Engaged  Modes of Intervention:  Socialization and Support  Additional Comments:  Patient attended and participated in group tonight. He reports that he just came in today from the hospital.  He has been socializing with peers.   Lita MainsFrancis, Joetta [facility] 04/05/2016, 9:24 PM
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
## 2016-04-05 NOTE — BHH Suicide Risk Assessment (Signed)
[facility]   Nursing information obtained from:  Patient Demographic factors:  Male, Divorced or widowed, Caucasian, Low socioeconomic status, Living alone, Unemployed Current Mental Status:  Thoughts of violence towards others, Plan to harm others Loss Factors:  Decrease in vocational status Historical Factors:  Family history of suicide, Family history of mental illness or substance abuse Risk Reduction Factors:  Responsible for children under 44 years of age, Positive social support  Total Time spent with patient: 30 minutes Principal Problem: Schizoaffective disorder, bipolar type (HCC) Diagnosis:   Patient Active Problem List   Diagnosis Date Noted  . Cannabis use disorder, moderate, in early remission [F12.90] 04/05/2016  . Tobacco use disorder [F17.200] 04/05/2016  . S/P hernia surgery [Z61.096[Z98.890, Z87.19] 12/15/2014  . Cannabis abuse [F12.10] 04/29/2013  . Schizoaffective disorder, bipolar type (HCC) [F25.0] 04/23/2013   Subjective Data:Please see H&P.   Continued Clinical Symptoms:  Alcohol Use Disorder Identification Test Final Score (AUDIT): 3 The "Alcohol Use Disorders Identification Test", Guidelines for Use in Primary Care, Second Edition.  World Science writerHealth Organization [facility](WHO). Score between 0-7:  no or low risk or alcohol related problems. Score between 8-15:  moderate risk of alcohol related problems. Score between 16-19:  high risk of alcohol related problems. Score 20 or above:  warrants further diagnostic evaluation for alcohol dependence and treatment.   CLINICAL FACTORS:   Severe Anxiety and/or Agitation Currently Psychotic Unstable or Poor Therapeutic Relationship Previous Psychiatric Diagnoses and Treatments   Musculoskeletal: Strength & Muscle Tone: within normal limits Gait & Station: normal Patient leans: N/A  Psychiatric Specialty Exam: Physical Exam  ROS  Blood pressure 100/67, pulse (!) 117, temperature 98.1 F (36.7 C),  temperature source Oral, resp. rate 16, height 5\' 10"  (1.778 m), weight 96.2 kg (212 lb), SpO2 98 %.Body mass index is 30.42 kg/m.  Please see H&P.                                                          COGNITIVE FEATURES THAT CONTRIBUTE TO RISK:  Closed-mindedness, Polarized thinking and Thought constriction (tunnel vision)    SUICIDE RISK:   Mild:  Suicidal ideation of limited frequency, intensity, duration, and specificity.  There are no identifiable plans, no associated intent, mild dysphoria and related symptoms, good self-control (both objective and subjective assessment), few other risk factors, and identifiable protective factors, including available and accessible social support.   PLAN OF CARE: Please see H&P.   I certify that inpatient services furnished can reasonably be expected to improve the patient's condition.  Detroit Frieden, MD 04/05/2016, 2:23 PM

## 2016-04-05 NOTE — BHH Counselor (Signed)
[facility], pt has been accepted to [facility] to Dr Elna BreslowEappen.  Writer notified pt's RN who will arrange transportation.  Evette Cristalaroline Paige Manuelito Poage, [place] Therapeutic Triage Specialist

## 2016-04-05 NOTE — ED Notes (Signed)
OOB to bathroom
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
## 2016-04-06 LAB — VALPROIC ACID LEVEL: Valproic Acid Lvl: 71 ug/mL (ref 50.0–100.0)

## 2016-04-06 LAB — TSH: TSH: 4.05 u[IU]/mL (ref 0.350–4.500)

## 2016-04-06 NOTE — Progress Notes (Signed)
D: Pt. is up and visible in the milieu, watching TV. Pt. attended karaoke this evening. Denies having any SI/HI and AVH at this time. Rates pain as 0/10. Pt. presents with a euphoric and silly affect and mood. Overall, Pt. was pleasant and cooperative during interaction and enjoys holding a conversation.   A: Encouragement and support given. Meds. ordered and given.  R: Safety maintained with Q 15 checks. Continues to follow treatment plan and will monitor closely.

## 2016-04-06 NOTE — Plan of Care (Signed)
Problem: Safety: Goal: Periods of time without injury will increase Outcome: Progressing Pt. remains a low fall risk, denies SI/HI at this time, Q 15 checks in place for safety.    

## 2016-04-06 NOTE — Progress Notes (Signed)
D: Pt denies any form of depression, anxiety, pain, SI, HI or AVH; he states, "I feel good." Pt was however observed pacing the hallway. Pt remained cooperative and pleasant. A: Medications offered as prescribed.  Support, encouragement, and safe environment provided.  15-minute safety checks continue. R: Pt was med compliant.  Pt attended wrap-up group. Safety checks continue.

## 2016-04-06 NOTE — Progress Notes (Signed)
DAR NOTE: Patient presents with euphoric mood and affect.  Patient was hyperactive on the unit.  Denies pain, auditory and visual hallucinations.  Rates depression at 0, hopelessness at 0, and anxiety at 0.  Maintained on routine safety checks.  Medications given as prescribed.  Support and encouragement offered as needed.  Attended group and participated.  States goal for today is "being kind and not mean."  Patient observed socializing with peers in the dayroom.  Offered no complaint.

## 2016-04-06 NOTE — BHH Group Notes (Signed)
Type of Therapy: Process Group Therapy  Participation Level:  Active  Participation Quality:  Appropriate  Affect:  Flat  Cognitive:  Oriented  Insight:  Improving  Engagement in Group:  Limited  Engagement in Therapy:  Limited  Modes of Intervention:  Activity, Clarification, Education, Problem-solving and Support  Summary of Progress/Problems: Patient was invited, chose not to attend.   Today's group addressed balance in life. Patients participated in the discussion about when their life was in balance and out of balance and how this feels. Patients discussed ways to get back in balance and short term goals they can work on to get where they want to be.   

## 2016-04-06 NOTE — Progress Notes (Signed)
[facility] MD Progress Note  04/06/2016 12:03 PM Jerry Frye  MRN:  [id number] Subjective:  Patient states " I just have to stay away from her , she is a real satan worshipper , who does animal sacrifices in her yard . I just want her to stay away from my kids. I would not do anything to her ."   Objective:Jerry J Stanleyis a 45 y.o.caucasian male, who is separated , on SSD , [place] , has a hx of schizoaffective disorder , who [facility] , and homicidal ideation towards his sister in [place].  Patient seen and chart reviewed.Discussed patient with treatment team.  Patient today is seen as euphoric , pleasant , continues to deny any new concerns. Pt reports he is still angry at his sister in law , but he would not go and hurt her , but just wants her stay away from his children. When Clinical research associatewriter question patient about how she disrespected his kids - he stated he did not want to go in to the details . Pt per staff - otherwise seen on the unit - with no complaints , is visible in milieu , has been compliant with medications.    Principal Problem: Schizoaffective disorder, bipolar type (HCC) Diagnosis:   Patient Active Problem List   Diagnosis Date Noted  . Cannabis use disorder, moderate, in early remission [F12.90] 04/05/2016  . Tobacco use disorder [F17.200] 04/05/2016  . S/P hernia surgery [N82.956[Z98.890, Z87.19] 12/15/2014  . Cannabis abuse [F12.10] 04/29/2013  . Schizoaffective disorder, bipolar type (HCC) [F25.0] 04/23/2013   Total Time spent with patient: 25 minutes  Past Psychiatric History: Please see H&P.   Past Medical History:  Past Medical History:  Diagnosis Date  . Schizophrenic disorder [facility](HCC)     Past Surgical History:  Procedure Laterality Date  . INGUINAL HERNIA REPAIR Right 12/15/2014  . INGUINAL HERNIA REPAIR Right 12/15/2014   Procedure: RIGHT INGUINAL HERNIA REPAIR WITH MESH;  Surgeon: Axel FillerArmando Ramirez, MD;  Location: [facility] OR;  Service: General;   Laterality: Right;  . NO PAST SURGERIES     Family History:  Family History  Problem Relation Age of Onset  . Diabetes Mother   . Heart failure Mother   . Schizophrenia Father   . Mental illness Paternal Grandfather   . Mental illness Paternal Aunt    Family Psychiatric  History: Please see H&P.  Social History: Please see H&P.  History  Alcohol Use  . Yes    Comment: occ     History  Drug Use No    Social History   Social History  . Marital status: Divorced    Spouse name: N/A  . Number of children: N/A  . Years of education: N/A   Social History Main Topics  . Smoking status: Former Games developermoker  . Smokeless tobacco: Never Used  . Alcohol use Yes     Comment: occ  . Drug use: No  . Sexual activity: Not Asked   Other Topics Concern  . None   Social History Narrative  . None   Additional Social History:                         Sleep: Fair  Appetite:  Fair  Current Medications: Current Facility-Administered Medications  Medication Dose Route Frequency Provider Last Rate Last Dose  . acetaminophen (TYLENOL) tablet 650 mg  650 mg Oral Q6H PRN Kristeen MansFran E Hobson, NP      .  alum & mag hydroxide-simeth (MAALOX/MYLANTA) 200-200-20 MG/5ML suspension 30 mL  30 mL Oral Q4H PRN Charm Rings, NP      . benztropine (COGENTIN) tablet 1 mg  1 mg Oral BID Charm Rings, NP   1 mg at 04/06/16 0835  . diphenhydrAMINE (BENADRYL) capsule 50 mg  50 mg Oral Q6H PRN Jomarie Longs, MD       Or  . diphenhydrAMINE (BENADRYL) injection 50 mg  50 mg Intramuscular Q6H PRN Dewaun Kinzler, MD      . divalproex (DEPAKOTE) DR tablet 500 mg  500 mg Oral BID PC Charm Rings, NP   500 mg at 04/06/16 0835  . haloperidol (HALDOL) tablet 5 mg  5 mg Oral Q6H PRN Jomarie Longs, MD       Or  . haloperidol lactate (HALDOL) injection 5 mg  5 mg Intramuscular Q6H PRN Jomarie Longs, MD      . LORazepam (ATIVAN) tablet 1 mg  1 mg Oral Q6H PRN Jomarie Longs, MD   1 mg at 04/05/16 2118   Or   . LORazepam (ATIVAN) injection 1 mg  1 mg Intramuscular Q6H PRN Dal Blew, MD      . magnesium hydroxide (MILK OF MAGNESIA) suspension 30 mL  30 mL Oral Daily PRN Charm Rings, NP      . traZODone (DESYREL) tablet 100 mg  100 mg Oral QHS Charm Rings, NP   100 mg at 04/05/16 2118  . ziprasidone (GEODON) capsule 60 mg  60 mg Oral BID WC Charm Rings, NP   60 mg at 04/06/16 9604    Lab Results: No results found for this or any previous visit (from the past 48 hour(s)).  Blood Alcohol level:  Lab Results  Component Value Date   ETH <5 04/02/2016   ETH <11 04/22/2013    Metabolic Disorder Labs: No results found for: HGBA1C, MPG No results found for: PROLACTIN No results found for: CHOL, TRIG, HDL, CHOLHDL, VLDL, LDLCALC  Physical Findings: AIMS: Facial and Oral Movements Muscles of Facial Expression: None, normal Lips and Perioral Area: None, normal Jaw: None, normal Tongue: None, normal,Extremity Movements Upper (arms, wrists, hands, fingers): None, normal Lower (legs, knees, ankles, toes): None, normal, Trunk Movements Neck, shoulders, hips: None, normal, Overall Severity Severity of abnormal movements (highest score from questions above): None, normal Incapacitation due to abnormal movements: None, normal Patient's awareness of abnormal movements (rate only patient's report): No Awareness, Dental Status Current problems with teeth and/or dentures?: No Does patient usually wear dentures?: No  CIWA:    COWS:     Musculoskeletal: Strength & Muscle Tone: within normal limits Gait & Station: normal Patient leans: N/A  Psychiatric Specialty Exam: Physical Exam  Nursing note and vitals reviewed.   Review of Systems  Psychiatric/Behavioral: The patient is nervous/anxious.   All other systems reviewed and are negative.   Blood pressure 95/70, pulse (!) 108, temperature 98.1 F (36.7 C), temperature source Oral, resp. rate 20, height 5\' 10"  (1.778 m), weight 96.2  kg (212 lb), SpO2 98 %.Body mass index is 30.42 kg/m.  General Appearance: Fairly Groomed  Eye Contact:  Fair  Speech:  Normal Rate  Volume:  Normal  Mood:  Euphoric improving  Affect:  Congruent  Thought Process:  Goal Directed and Descriptions of Associations: Intact  Orientation:  Full (Time, Place, and Person)  Thought Content:  Logical and Rumination  Suicidal Thoughts:  No  Homicidal Thoughts:  No  Memory:  Immediate;   Fair Recent;   Fair Remote;   Fair  Judgement:  Fair  Insight:  Fair  Psychomotor Activity:  Normal  Concentration:  Concentration: Fair and Attention Span: Fair  Recall:  [employer] of Knowledge:  Fair  Language:  Fair  Akathisia:  No  Handed:  Right  AIMS (if indicated):     Assets:  Desire for Improvement  ADL's:  Intact  Cognition:  WNL  Sleep:  Number of Hours: 6.75     Treatment Plan Summary:Jerry J Stanleyis a 45 y.o.caucasian male, who is separated , on SSD , lives in [place] , has a hx of schizoaffective disorder , who presented to [facility] for mood lability , and homicidal ideation towards his sister in Social worker.  Patient today seen as euphoric , although states his mood is more stable now that he is back on medications, denies any new concerns. Will expand collateral information from brother.CSW working on the same.   Daily contact with patient to assess and evaluate symptoms and progress in treatment and Medication management Will continue Depakote 500 mg po bid for mood sx. Depakote level tomorrow AM. Will continue Geodon 60 mg po bid for psychosis. Will continue Trazodone 100 mg po qhs for sleep. Will continue  Cogentin 1 mg po bid for EPS. Will make available PRN medications as per agitation protocol. Will continue to monitor vitals ,medication compliance and treatment side effects while patient is here.  Will monitor for medical issues as well as call consult as needed.  Reviewed labs - cbc - wnl , cmp - K+ low - will replace , uds- negative  ,BAL<5 , pending  tsh, lipid panel, hba1c, pl , ekg for qtc- wnl  CSW will continue working on disposition. Will need to expand collateral information from family. Patient to participate in therapeutic milieu .     Bon Dowis, MD 04/06/2016, 12:03 PM

## 2016-04-06 NOTE — Tx Team (Signed)
Interdisciplinary Treatment and Diagnostic Plan Update  04/06/2016 Time of Session: 3:52 PM  BLANE WORTHINGTON MRN: [id number]  Principal Diagnosis: Schizoaffective disorder, bipolar type ([place])  Secondary Diagnoses: Principal Problem:   Schizoaffective disorder, bipolar type ([place]) Active Problems:   Cannabis use disorder, moderate, in early remission   Tobacco use disorder   Current Medications:  Current Facility-Administered Medications  Medication Dose Route Frequency Provider Last Rate Last Dose  . acetaminophen (TYLENOL) tablet 650 mg  650 mg Oral Q6H PRN Lurena Nida, NP      . alum & mag hydroxide-simeth (MAALOX/MYLANTA) 200-200-20 MG/5ML suspension 30 mL  30 mL Oral Q4H PRN Patrecia Pour, NP      . benztropine (COGENTIN) tablet 1 mg  1 mg Oral BID Patrecia Pour, NP   1 mg at 04/06/16 0835  . diphenhydrAMINE (BENADRYL) capsule 50 mg  50 mg Oral Q6H PRN Ursula Alert, MD       Or  . diphenhydrAMINE (BENADRYL) injection 50 mg  50 mg Intramuscular Q6H PRN Saramma Eappen, MD      . divalproex (DEPAKOTE) DR tablet 500 mg  500 mg Oral BID PC Patrecia Pour, NP   500 mg at 04/06/16 0835  . haloperidol (HALDOL) tablet 5 mg  5 mg Oral Q6H PRN Ursula Alert, MD       Or  . haloperidol lactate (HALDOL) injection 5 mg  5 mg Intramuscular Q6H PRN Ursula Alert, MD      . LORazepam (ATIVAN) tablet 1 mg  1 mg Oral Q6H PRN Ursula Alert, MD   1 mg at 04/05/16 2118   Or  . LORazepam (ATIVAN) injection 1 mg  1 mg Intramuscular Q6H PRN Saramma Eappen, MD      . magnesium hydroxide (MILK OF MAGNESIA) suspension 30 mL  30 mL Oral Daily PRN Patrecia Pour, NP      . traZODone (DESYREL) tablet 100 mg  100 mg Oral QHS Patrecia Pour, NP   100 mg at 04/05/16 2118  . ziprasidone (GEODON) capsule 60 mg  60 mg Oral BID WC Patrecia Pour, NP   60 mg at 04/06/16 4008    PTA Medications: Prescriptions Prior to Admission  Medication Sig Dispense Refill Last Dose  . benztropine (COGENTIN) 1 MG  tablet Take 1 tablet (1 mg total) by mouth 2 (two) times daily. (Patient not taking: Reported on 04/05/2016) 60 tablet 0 Not Taking at Unknown time  . divalproex (DEPAKOTE) 500 MG DR tablet Take 1 tablet (500 mg total) by mouth 2 (two) times daily after a meal. (Patient not taking: Reported on 04/05/2016) 60 tablet 0 Not Taking at Unknown time  . ziprasidone (GEODON) 60 MG capsule Take 1 capsule (60 mg total) by mouth 2 (two) times daily with a meal. (Patient not taking: Reported on 04/05/2016) 60 capsule 0 Not Taking at Unknown time    Treatment Modalities: Medication Management, Group therapy, Case management,  1 to 1 session with clinician, Psychoeducation, Recreational therapy.   Physician Treatment Plan for Primary Diagnosis: Schizoaffective disorder, bipolar type ([place]) Long Term Goal(s): Improvement in symptoms so as ready for discharge  Short Term Goals: Compliance with prescribed medications will improve  Medication Management: Evaluate patient's response, side effects, and tolerance of medication regimen.  Therapeutic Interventions: 1 to 1 sessions, Unit Group sessions and Medication administration.  Evaluation of Outcomes: Adequate for Discharge  Physician Treatment Plan for Secondary Diagnosis: Principal Problem:   Schizoaffective disorder, bipolar type ([place]) Active  Problems:   Cannabis use disorder, moderate, in early remission   Tobacco use disorder   Long Term Goal(s): Improvement in symptoms so as ready for discharge  Short Term Goals: Ability to identify and develop effective coping behaviors will improve  Medication Management: Evaluate patient's response, side effects, and tolerance of medication regimen.  Therapeutic Interventions: 1 to 1 sessions, Unit Group sessions and Medication administration.  Evaluation of Outcomes: Progressing   RN Treatment Plan for Primary Diagnosis: Schizoaffective disorder, bipolar type ([place]) Long Term Goal(s): Knowledge of disease  and therapeutic regimen to maintain health will improve  Short Term Goals: Ability to verbalize frustration and anger appropriately will improve and Ability to verbalize feelings will improve  Medication Management: RN will administer medications as ordered by provider, will assess and evaluate patient's response and provide education to patient for prescribed medication. RN will report any adverse and/or side effects to prescribing provider.  Therapeutic Interventions: 1 on 1 counseling sessions, Psychoeducation, Medication administration, Evaluate responses to treatment, Monitor vital signs and CBGs as ordered, Perform/monitor CIWA, COWS, AIMS and Fall Risk screenings as ordered, Perform wound care treatments as ordered.  Evaluation of Outcomes: Progressing   LCSW Treatment Plan for Primary Diagnosis: Schizoaffective disorder, bipolar type ([place]) Long Term Goal(s): Safe transition to appropriate next level of care at discharge, Engage patient in therapeutic group addressing interpersonal concerns.  Short Term Goals: Engage patient in aftercare planning with referrals and resources  Therapeutic Interventions: Assess for all discharge needs, 1 to 1 time with Social worker, Explore available resources and support systems, Assess for adequacy in community support network, Educate family and significant other(s) on suicide prevention, Complete Psychosocial Assessment, Interpersonal group therapy.  Evaluation of Outcomes: Met     Progress in Treatment: Attending groups: Yes Participating in groups: Yes Taking medication as prescribed: Yes Toleration medication: Yes, no side effects reported at this time Family/Significant other contact made: Yes Patient understands diagnosis: Yes AEB asking to be restarted on meds Discussing patient identified problems/goals with staff: Yes Medical problems stabilized or resolved: Yes Denies suicidal/homicidal ideation: Yes Issues/concerns per patient  self-inventory: None Other: N/A  New problem(s) identified: None identified at this time.   New Short Term/Long Term Goal(s): None identified at this time.   Discharge Plan or Barriers: return home, follow up outpt  Reason for Continuation of Hospitalization:  Homicidal ideation  Medication stabilization   Estimated Length of Stay: 1-3 days  Attendees: Patient: 04/06/2016  3:52 PM  Physician: Ursula Alert, MD 04/06/2016  3:52 PM  Nursing: Hoy Register, RN 04/06/2016  3:52 PM  RN Care Manager: Lars Pinks, RN 04/06/2016  3:52 PM  Social Worker: Ripley Fraise 04/06/2016  3:52 PM  Recreational Therapist: Laretta Bolster  04/06/2016  3:52 PM  Other: Norberto Sorenson 04/06/2016  3:52 PM  Other:  04/06/2016  3:52 PM    Scribe for Treatment Team:  Roque Lias 04/06/2016 3:52 PM

## 2016-04-06 NOTE — Progress Notes (Signed)
Recreation Therapy Notes  Date: 04/06/16 Time: 1000 Location: [address] Dayroom  Group Topic: Coping Skills  Goal Area(s) Addresses:  Patients will be able to identify positive coping skills. Patients will be able to identify benefits of coping skills post d/c.  Behavioral Response: Engaged  Intervention: Worksheets, Wellsite geologistscissors, glue, Holiday representativeconstruction paper, magazines  Activity: CounsellorCoping Skills Collage.  LRT went over the concept of coping skills and why they are important.  Patients were to use the magazines to cut out pictures of the coping skills they use or would like to learn.  Patients were then to glue the pictures onto the worksheet.  The worksheet is broken down into 5 components were coping skills can be used.  Those components are diversions, social, cognitive, tension releasers and physical.    Education: Coping Skills, Discharge Planning.   Education Outcome: Acknowledges understanding/In group clarification offered/Needs additional education.   Clinical Observations/Feedback: Pt identified his coping skills as cars for distractions; beautiful women for cognitive; and walking for tension releasers.  Pt expressed he talks to people and if he sees someone walking he will take them where they need to go.   [name] Persais [place], LRT/CTRS      Caroll RancherLindsay, Zayan Delvecchio A 04/06/2016 12:01 PM

## 2016-04-07 LAB — LIPID PANEL
CHOLESTEROL: 148 mg/dL (ref 0–200)
HDL: 37 mg/dL — ABNORMAL LOW (ref >40)
LDL CALC: 58 mg/dL (ref 0–99)
TRIGLYCERIDES: 266 mg/dL — AB (ref <150)
Total CHOL/HDL Ratio: 4 RATIO
VLDL: 53 mg/dL — AB (ref 0–40)

## 2016-04-07 LAB — HEMOGLOBIN A1C
HEMOGLOBIN A1C: 5.4 % (ref 4.8–5.6)
MEAN PLASMA GLUCOSE: 108 mg/dL

## 2016-04-07 LAB — PROLACTIN: Prolactin: 24.5 ng/mL — ABNORMAL HIGH (ref 4.0–15.2)

## 2016-04-07 MED ORDER — TRAZODONE HCL 100 MG PO TABS: 100.0000 mg | ORAL_TABLET | Freq: Every day | ORAL | 0 refills | Status: AC

## 2016-04-07 MED ORDER — DIVALPROEX SODIUM 500 MG PO DR TAB: 500.0000 mg | DELAYED_RELEASE_TABLET | Freq: Two times a day (BID) | ORAL | 0 refills | Status: AC

## 2016-04-07 MED ORDER — ZIPRASIDONE HCL 60 MG PO CAPS: 60.0000 mg | ORAL_CAPSULE | Freq: Two times a day (BID) | ORAL | 0 refills | Status: AC

## 2016-04-07 MED ORDER — BENZTROPINE MESYLATE 1 MG PO TABS: 1.0000 mg | ORAL_TABLET | Freq: Two times a day (BID) | ORAL | 0 refills | Status: AC

## 2016-04-07 NOTE — Discharge Summary (Signed)
Physician Discharge Summary Note  Patient:  Jerry Frye is an 45 y.o., male MRN:  [id number] DOB:  1970/12/14 Patient phone:  [phone] (home)  Patient address:   [address] [place] [id number],  Total Time spent with patient: 30 minutes  Date of Admission:  04/05/2016 Date of Discharge: 04/07/2016  Reason for Admission:    Principal Problem: Schizoaffective disorder, bipolar type [facility](HCC) Discharge Diagnoses: Patient Active Problem List   Diagnosis Date Noted  . Cannabis use disorder, moderate, in early remission [F12.90] 04/05/2016  . Tobacco use disorder [F17.200] 04/05/2016  . S/P hernia surgery [G29.528[Z98.890, Z87.19] 12/15/2014  . Cannabis abuse [F12.10] 04/29/2013  . Schizoaffective disorder, bipolar type (HCC) [F25.0] 04/23/2013    Past Psychiatric History: see HPI  Past Medical History:  Past Medical History:  Diagnosis Date  . Schizophrenic disorder [facility](HCC)     Past Surgical History:  Procedure Laterality Date  . INGUINAL HERNIA REPAIR Right 12/15/2014  . INGUINAL HERNIA REPAIR Right 12/15/2014   Procedure: RIGHT INGUINAL HERNIA REPAIR WITH MESH;  Surgeon: Axel FillerArmando Ramirez, MD;  Location: [facility] OR;  Service: General;  Laterality: Right;  . NO PAST SURGERIES     Family History:  Family History  Problem Relation Age of Onset  . Diabetes Mother   . Heart failure Mother   . Schizophrenia Father   . Mental illness Paternal Grandfather   . Mental illness Paternal Aunt    Family Psychiatric  History: see HPI Social History:  History  Alcohol Use  . Yes    Comment: occ     History  Drug Use No    Social History   Social History  . Marital status: Divorced    Spouse name: N/A  . Number of children: N/A  . Years of education: N/A   Social History Main Topics  . Smoking status: Former Games developermoker  . Smokeless tobacco: Never Used  . Alcohol use Yes     Comment: occ  . Drug use: No  . Sexual activity: Not Asked   Other Topics Concern  . None    Social History Narrative  . None    Hospital Course:  Jerry Namdward J Stanleyis a 45 y.o.caucasian male, who is separated, on SSD, [place] , has a hx of schizoaffective disorder, who [facility], and homicidal ideation towards his sister in Social workerlaw. Pt presents this date under IVC.  Per IVC:  "Respondent has a long history of mental illness. The respondent hears Jesus and the Devil telling him to do bad things. Respondent sent a text message to brother's wife this date threatening to kill her in front of him". "  Jerry Frye was admitted for Schizoaffective disorder, bipolar type (HCC) and crisis management.  Patient was treated with medications with their indications listed below in detail under Medication List.  Medical problems were identified and treated as needed.  Home medications were restarted as appropriate.  Improvement was monitored by observation and Jerry Frye daily report of symptom reduction.  Emotional and mental status was monitored by daily self inventory reports completed by Jerry Frye and clinical staff.  Patient reported continued improvement, denied any new concerns.  Patient had been compliant on medications and denied side effects.  Support and encouragement was provided.         Jerry Frye was evaluated by the treatment team for stability and plans for continued recovery upon discharge.  Patient was offered further treatment  options upon discharge including Residential, Intensive Outpatient and Outpatient treatment. Patient will follow up with agency listed below for medication management and counseling.  Encouraged patient to maintain satisfactory support network and home environment.  Advised to adhere to medication compliance and outpatient treatment follow up.  Prescriptions provided.       Jerry Dad motivation was an integral factor for scheduling further treatment.  Employment, transportation, bed availability, health  status, family support, and any pending legal issues were also considered during patient's hospital stay.  Upon completion of this admission the patient was both mentally and medically stable for discharge denying suicidal/homicidal ideation, auditory/visual/tactile hallucinations, delusional thoughts and paranoia.      Physical Findings: AIMS: Facial and Oral Movements Muscles of Facial Expression: None, normal Lips and Perioral Area: None, normal Jaw: None, normal Tongue: None, normal,Extremity Movements Upper (arms, wrists, hands, fingers): None, normal Lower (legs, knees, ankles, toes): None, normal, Trunk Movements Neck, shoulders, hips: None, normal, Overall Severity Severity of abnormal movements (highest score from questions above): None, normal Incapacitation due to abnormal movements: None, normal Patient's awareness of abnormal movements (rate only patient's report): No Awareness, Dental Status Current problems with teeth and/or dentures?: No Does patient usually wear dentures?: No  CIWA:    COWS:     Musculoskeletal: Strength & Muscle Tone: within normal limits Gait & Station: normal Patient leans: N/A  Psychiatric Specialty Exam: Physical Exam  Nursing note and vitals reviewed.   ROS  Blood pressure 126/75, pulse (!) 108, temperature 97.8 F (36.6 C), temperature source Oral, resp. rate 18, height 5\' 10"  (1.778 m), weight 96.2 kg (212 lb), SpO2 98 %.Body mass index is 30.42 kg/m.   Have you used any form of tobacco in the last 30 days? (Cigarettes, Smokeless Tobacco, Cigars, and/or Pipes): Yes  Has this patient used any form of tobacco in the last 30 days? (Cigarettes, Smokeless Tobacco, Cigars, and/or Pipes) Yes, N/A  Blood Alcohol level:  Lab Results  Component Value Date   [facility] <5 04/02/2016   ETH <11 04/22/2013    Metabolic Disorder Labs:  Lab Results  Component Value Date   HGBA1C 5.4 04/06/2016   MPG 108 04/06/2016   Lab Results  Component Value  Date   PROLACTIN 24.5 (H) 04/06/2016   Lab Results  Component Value Date   CHOL 148 04/07/2016   TRIG 266 (H) 04/07/2016   HDL 37 (L) 04/07/2016   CHOLHDL 4.0 04/07/2016   VLDL 53 (H) 04/07/2016   LDLCALC 58 04/07/2016    See Psychiatric Specialty Exam and Suicide Risk Assessment completed by Attending Physician prior to discharge.  Discharge destination:  Home  Is patient on multiple antipsychotic therapies at discharge:  No   Has Patient had three or more failed trials of antipsychotic monotherapy by history:  No  Recommended Plan for Multiple Antipsychotic Therapies: NA     Medication List    TAKE these medications     Indication  benztropine 1 MG tablet Commonly known as:  COGENTIN Take 1 tablet (1 mg total) by mouth 2 (two) times daily.  Indication:  Extrapyramidal Reaction caused by Medications   divalproex 500 MG DR tablet Commonly known as:  DEPAKOTE Take 1 tablet (500 mg total) by mouth 2 (two) times daily after a meal.  Indication:  Manic Phase of Manic-Depression   traZODone 100 MG tablet Commonly known as:  DESYREL Take 1 tablet (100 mg total) by mouth at bedtime.  Indication:  Aggressive Behavior, Trouble Sleeping  ziprasidone 60 MG capsule Commonly known as:  GEODON Take 1 capsule (60 mg total) by mouth 2 (two) times daily with a meal.  Indication:  Manic-Depression      Follow-up Information    [facility] Follow up on 04/11/2016.   Why:  Tuesday at 2:30 with Nyra Jabs Contact information: [address]  [place] [phone]       [facility] program .   Why:  Call her when you get home about re-instating your disability.  She is aware that you need this done. Contact information: Your new worker's name is Bridgette Little Her number is [phone]          Follow-up recommendations:  Activity:  as tol Diet:  as tol  Comments:  1.  Take all your medications as prescribed.   2.  Report any adverse side effects to  outpatient provider. 3.  Patient instructed to not use alcohol or illegal drugs while on prescription medicines. 4.  In the event of worsening symptoms, instructed patient to call 911, the crisis hotline or go to nearest emergency room for evaluation of symptoms.  Signed: Lindwood Qua, NP [facility] 04/07/2016, 2:22 PM

## 2016-04-07 NOTE — [facility] Suicide Risk Assessment (Signed)
[facility] Dis[facility] Suicide [facility]   Principal Problem: Schizoaffective disorder, bipolar type [facility](HCC) Discharge Diagnoses:  Patient Active Problem List   Diagnosis Date Noted  . Cannabis use disorder, moderate, in early remission [F12.90] 04/05/2016  . Tobacco use disorder [F17.200] 04/05/2016  . S/P hernia surgery [W09.811[Z98.890, Z87.19] 12/15/2014  . Cannabis abuse [F12.10] 04/29/2013  . Schizoaffective disorder, bipolar type (HCC) [F25.0] 04/23/2013    Total Time spent with patient: 30 minutes  Musculoskeletal: Strength & Muscle Tone: within normal limits Gait & Station: normal Patient leans: N/A  Psychiatric Specialty Exam: Review of Systems  Psychiatric/Behavioral: Negative for depression and suicidal ideas. The patient is not nervous/anxious.   All other systems reviewed and are negative.   Blood pressure 126/75, pulse (!) 108, temperature 97.8 F (36.6 C), temperature source Oral, resp. rate 18, height 5\' 10"  (1.778 m), weight 96.2 kg (212 lb), SpO2 98 %.Body mass index is 30.42 kg/m.  General Appearance: Casual  Eye Contact::  Fair  Speech:  Clear and Coherent409  Volume:  Normal  Mood:  Euthymic  Affect:  Appropriate  Thought Process:  Goal Directed and Descriptions of Associations: Intact  Orientation:  Full (Time, Place, and Person)  Thought Content:  Logical  Suicidal Thoughts:  No  Homicidal Thoughts:  No  Memory:  Immediate;   Fair Recent;   Fair Remote;   Fair  Judgement:  Fair  Insight:  Fair  Psychomotor Activity:  Normal  Concentration:  Fair  Recall:  FiservFair  Fund of Knowledge:Fair  Language: Fair  Akathisia:  No  Handed:  Right  AIMS (if indicated):     Assets:  Desire for Improvement  Sleep:  Number of Hours: 6.75  Cognition: WNL  ADL's:  Intact   Mental Status Per Nursing Assessment::   On Admission:  Thoughts of violence towards others, Plan to harm others  Demographic Factors:  Caucasian  Loss Factors: NA  Historical  Factors: NA  Risk Reduction Factors:   Positive social support  Continued Clinical Symptoms:  Previous Psychiatric Diagnoses and Treatments  Cognitive Features That Contribute To Risk:  None    Suicide Risk:  Minimal: No identifiable suicidal ideation.  Patients presenting with no risk factors but with morbid ruminations; may be classified as minimal risk based on the severity of the depressive symptoms    Plan Of Care/Follow-up recommendations:  Activity:  no restrictions Other:  none  Jerry Mcquiston, MD 04/07/2016, 9:42 AM

## 2016-04-07 NOTE — Progress Notes (Signed)
  [facility] :  Will you be returning to the same living situation after discharge:  Yes,  home At discharge, do you have transportation home?: Yes,  bus pass Do you have the ability to pay for your medications: Yes,  MCD  Release of information consent forms completed and in the chart;  Patient's signature needed at discharge.  Patient to Follow up at: Follow-up Information    [facility] .   Contact information: [address]  [place] [phone]          Next level of care provider has access to [facility] Link:no  Safety Planning and Suicide Prevention discussed: Yes,  yes  Have you used any form of tobacco in the last 30 days? (Cigarettes, Smokeless Tobacco, Cigars, and/or Pipes): Yes  Has patient been referred to the Quitline?: Patient refused referral  Patient has been referred for addiction treatment: N/A  Jerry Frye 04/07/2016, 10:03 AM

## 2016-04-07 NOTE — Progress Notes (Signed)
Recreation Therapy Notes  Date: 04/07/16 Time: 1000 Location: 500 Hall Dayroom  Group Topic: Communication, Team Building, Problem Solving  Goal Area(s) Addresses:  Patient will effectively work with peer towards shared goal.  Patient will identify skill used to make activity successful.  Patient will identify how skills used during activity can be used to reach post d/c goals.   Behavioral Response: Engaged  Intervention: STEM Activity   Activity: Glass blower/designeripe Cleaner Tower. In teams, patients were asked to build the tallest freestanding tower possible out of 15 pipe cleaners. Systematically resources were removed, for example patient ability to use both hands and patient ability to verbally communicate.    Education: Pharmacist, communityocial Skills, Building control surveyorDischarge Planning.   Education Outcome: Acknowledges education/In group clarification offered/Needs additional education.   Clinical Observations/Feedback: Pt expressed he had to "feel out the pieces, relax and work through it" to complete the task.  Pt stated he had to learn how to lean on his partner for help to complete the activity.   [name] [name], LRT/CTRS      Caroll RancherLindsay, Jerry Frye 04/07/2016 12:17 PM

## 2017-09-16 ENCOUNTER — Inpatient Hospital Stay
Admit: 2017-09-16 | Discharge: 2017-09-16 | Disposition: A | Discharge status: Other Acute Inpatient Hospital | Payer: Self-pay | Attending: Emergency Medicine

## 2017-09-16 DIAGNOSIS — F3112 Bipolar disorder, current episode manic without psychotic features, moderate: Secondary | ICD-10-CM

## 2017-09-16 LAB — DRUG SCREEN, URINE
AMPHETAMINES: NEGATIVE
BARBITURATES: NEGATIVE
BENZODIAZEPINES: NEGATIVE
COCAINE: NEGATIVE
METHADONE: NEGATIVE
OPIATES: NEGATIVE
PCP(PHENCYCLIDINE): NEGATIVE
THC (TH-CANNABINOL): NEGATIVE

## 2017-09-16 LAB — METABOLIC PANEL, COMPREHENSIVE
A-G Ratio: 0.9 — ABNORMAL LOW (ref 1.1–2.2)
ALT (SGPT): 53 U/L (ref 12–78)
AST (SGOT): 24 U/L (ref 15–37)
Albumin: 4 g/dL (ref 3.5–5.0)
Alk. phosphatase: 75 U/L (ref 45–117)
Anion gap: 9 mmol/L (ref 5–15)
BUN/Creatinine ratio: 15 (ref 12–20)
BUN: 16 MG/DL (ref 6–20)
Bilirubin, total: 0.8 MG/DL (ref 0.2–1.0)
CO2: 26 mmol/L (ref 21–32)
Calcium: 8.8 MG/DL (ref 8.5–10.1)
Chloride: 103 mmol/L (ref 97–108)
Creatinine: 1.09 MG/DL (ref 0.70–1.30)
GFR est AA: >60 mL/min/{1.73_m2} (ref >60)
GFR est non-AA: >60 mL/min/{1.73_m2} (ref >60)
Globulin: 4.3 g/dL — ABNORMAL HIGH (ref 2.0–4.0)
Glucose: 135 mg/dL — ABNORMAL HIGH (ref 65–100)
Potassium: 3.6 mmol/L (ref 3.5–5.1)
Protein, total: 8.3 g/dL — ABNORMAL HIGH (ref 6.4–8.2)
Sodium: 138 mmol/L (ref 136–145)

## 2017-09-16 LAB — URINALYSIS W/MICROSCOPIC
Bacteria: NEGATIVE /hpf
Blood: NEGATIVE
Glucose: NEGATIVE mg/dL
Leukocyte Esterase: NEGATIVE
Nitrites: NEGATIVE
Specific gravity: >1.03 — ABNORMAL HIGH (ref 1.003–1.030)
Urobilinogen: 1 EU/dL (ref 0.2–1.0)
pH (UA): 5.5 (ref 5.0–8.0)

## 2017-09-16 LAB — CBC WITH AUTOMATED DIFF
ABS. BASOPHILS: 0.1 10*3/uL (ref 0.0–0.1)
ABS. EOSINOPHILS: 0.1 10*3/uL (ref 0.0–0.4)
ABS. IMM. GRANS.: 0 10*3/uL (ref 0.00–0.04)
ABS. LYMPHOCYTES: 1.9 10*3/uL (ref 0.8–3.5)
ABS. MONOCYTES: 0.7 10*3/uL (ref 0.0–1.0)
ABS. NEUTROPHILS: 6.1 10*3/uL (ref 1.8–8.0)
ABSOLUTE NRBC: 0 10*3/uL (ref 0.00–0.01)
BASOPHILS: 1 % (ref 0–1)
EOSINOPHILS: 1 % (ref 0–7)
HCT: 50 % (ref 36.6–50.3)
HGB: 16.9 g/dL (ref 12.1–17.0)
IMMATURE GRANULOCYTES: 0 % (ref 0.0–0.5)
LYMPHOCYTES: 21 % (ref 12–49)
MCH: 32.3 PG (ref 26.0–34.0)
MCHC: 33.8 g/dL (ref 30.0–36.5)
MCV: 95.6 FL (ref 80.0–99.0)
MONOCYTES: 8 % (ref 5–13)
MPV: 9 FL (ref 8.9–12.9)
NEUTROPHILS: 69 % (ref 32–75)
NRBC: 0 PER 100 WBC
PLATELET: 367 10*3/uL (ref 150–400)
RBC: 5.23 M/uL (ref 4.10–5.70)
RDW: 12.4 % (ref 11.5–14.5)
WBC: 9 10*3/uL (ref 4.1–11.1)

## 2017-09-16 LAB — ETHYL ALCOHOL: ALCOHOL(ETHYL),SERUM: <10 MG/DL (ref <10)

## 2017-09-16 LAB — BILIRUBIN, CONFIRM: Bilirubin UA, confirm: NEGATIVE

## 2017-09-16 LAB — URINE CULTURE HOLD SAMPLE

## 2017-09-16 LAB — ACETAMINOPHEN: Acetaminophen level: <2 ug/mL — ABNORMAL LOW (ref 10–30)

## 2017-09-16 MED ORDER — ZIPRASIDONE 40 MG CAP
40 mg | ORAL | Status: AC
Start: 2017-09-16 — End: 2017-09-16
  Administered 2017-09-16: 11:00:00 via ORAL

## 2017-09-16 MED ORDER — BENZTROPINE 1 MG TAB
1 mg | Freq: Once | ORAL | Status: AC
Start: 2017-09-16 — End: 2017-09-16
  Administered 2017-09-16: 11:00:00 via ORAL

## 2017-09-16 MED ORDER — DIVALPROEX 500 MG TAB, DELAYED RELEASE
500 mg | Freq: Once | ORAL | Status: AC
Start: 2017-09-16 — End: 2017-09-16
  Administered 2017-09-16: 11:00:00 via ORAL

## 2017-09-16 MED FILL — BENZTROPINE 1 MG TAB: 1 mg | ORAL | Qty: 1

## 2017-09-16 MED FILL — DIVALPROEX 500 MG TAB, DELAYED RELEASE: 500 mg | ORAL | Qty: 1

## 2017-09-16 MED FILL — ZIPRASIDONE 20 MG CAP: 20 mg | ORAL | Qty: 3

## 2017-09-16 NOTE — ED Notes (Signed)
[facility] VSS. Pt calm and cooperative on departure.

## 2017-09-16 NOTE — Other (Signed)
Comprehensive Assessment Form Part 1      Section I - Disposition    Axis I - Bipolar d/o, manic episode VS Delusional d/o VS Schizophrenia     Bipolar by hx per pt  Axis II - deferred  Axis III - none reported  Axis IV - lack of structure, non-compliant with medications, poor primary support  Axis V - 35      The Medical Doctor to Psychiatrist conference was not completed.  Medical doctor is in agreement with psychiatrist disposition because this counselor conveyed to ED physician the recommendation of the on-call psychiatrist and they concurred.   The plan is to find an appropriate bed.  The on-call Psychiatrist consulted was Dr. Rachael Darbyhaudry.  The admitting Psychiatrist will be Dr. Jodelle GrossBD.  The admitting Diagnosis is Bipolar d/o, manic episode.  The Payor source is SELF PAY - [facility] SELF PAY.     Section II - Integrated Summary  Summary:    Patient is a 47 yo white male with history significant for Bipolar d/o (per patient) who arrives at ED via EMS accompanied by police with chief complaint of "taking a spiritual journey" and reports driving from [place] earlier today. Patient was found by police officer at a traffic light with his head on steering wheel sleeping. Patient states that he has not taken his medication for the last 3 days because of his spiritual journey. He reports being prescribed Depakote 500mg  2xday, Cogentin 1mg  2xday, and Geodon 60mg  2xday prescribed by [facility] in [place], [place].   He denies hearing voices or seeing things. However, he appears to be responding to internal stimuli as evidenced by eyes darting around the room sporadically, moving his hands as if explaining something to someone, and mumbling to himself between questions.   Patient presents as hyper friendly, unusually happy, smiles after giving responses, and is very compliant and eager to please. He is very excited about "being on a spiritual journey" and states, "I think I started  driving in [place] but I don't know where I ended up. I just drove and drove and drove. I turned here and there, you know, where ever I was supposed to and now I ended up here. Isn't that something?"    Patient adamantly denies suicidal and homicidal ideation. He denies auditory/visual hallucinations (which is questionable). He appears to demonstrate delusional thoughts as evidenced by stating, "I'm supposed to complete my spiritual journey." Patient is oriented to time, person, and situation but does not know what state he is in. Patient's ETOH is <10 and drug screen is negative.  Patient has history of several psych admissions with [facility] in [place], [place]. He reports no previous suicide attempts. He is followed [facility] [place].    Patient is amenable to a voluntary psychiatric admission. However, if patient changes his mind Crisis should be called to assess for a TDO.  NOTE: Patient's significant other/Darcy ([phone]) called and reported that patient has driven off in the past. She confirms medications and dosage amounts and asked that patient call her when he is able.         The patient has demonstrated mental capacity to provide informed consent.  The information is given by the patient and law enforcement.  The Chief Complaint is "I'm on a spiritual journey."  The Precipitant Factors are non-compliant with medications, hx of Bipolar d/o.  Previous Hospitalizations: [facility] in [place]  The patient has  not previously been in restraints.  Current Psychiatrist and/or Case Production designer, theatre/television/film is [facility] in in [place].    Lethality Assessment:    The potential for suicide noted by the following: active psychosis .  The potential for homicide is noted by the following : psychosis.  The patient has not been a perpetrator of sexual or physical abuse.  There are not pending charges.   The patient is felt to be at risk for self harm or harm to others.  The attending nurse was advised no further monitoring is necessary at this time.  Section III - Psychosocial  The patient's overall mood and attitude is manic, hyper verbal, tangential thought process.  Feelings of helplessness and hopelessness are not observed.  Generalized anxiety is not observed.  Panic is not observed. Phobias are not observed.  Obsessive compulsive tendencies are not observed.      Section IV - Mental Status Exam  The patient's appearance is unkempt and shows poor hygiene.  The patient's behavior is manic  and is restless. The patient is only aware of  time, person and situation (didn't know what state he was in).  The patient's speech is pressured.  The patient's mood is euphoric.  The range of affect is innappropriate.  The patient's thought content demonstrates delusions.  The thought process shows a flight of ideas and is tangential.  The patient's perception demonstrated changes in the following:  auditory  hallucinations. The patient's memory shows no evidence of impairment.  The patient's appetite shows no evidence of impairment.  The patient's sleep has evidence of insomnia. The patient shows little insight.  The patient's judgement is psychologically impaired.      Section V - Substance Abuse  The patient is not using substances.         Section VI - Living Arrangements  The patient is single.  The patient lives alone. The patient has 3 children ages 67, 63, and 55.  The patient does plan to return home upon discharge.  The patient does not have legal issues pending. The patient's source of income comes from disability and social security.  Religious and cultural practices have not been voiced at this time.    The patient's greatest support comes from [facility] and this person will be involved with the treatment.    The patient has not been in an event described as horrible or outside the  realm of ordinary life experience either currently or in the past.  The patient has not been a victim of sexual/physical abuse.    Section VII - Other Areas of Clinical Concern  The highest grade achieved is GED with the overall quality of school experience being described as ok.  The patient is currently disabled and speaks [place] as a primary language.  The patient has no communication impairments affecting communication. The patient's preference for learning can be described as: can read and write adequately.  The patient's hearing is normal.  The patient's vision is normal.      Sheffield Slider, LPC

## 2017-09-16 NOTE — ED Notes (Signed)
Pt changed into a hospital gown and belongings placed in nursing station

## 2017-09-16 NOTE — ED Triage Notes (Signed)
Pt arrives to the ED after "taking a spiritual journey" and [place] Pt put his head to the steering wheel and a police officer check in with him. Hx of bipolar and has not been taking his medication the last 3 days because of his spiritual journey. He denies hearing voices or seeing things.

## 2017-09-16 NOTE — ED Notes (Signed)
Bedside and Verbal shift change report given to Turkey[place]ia Control and instrumentation engineer(oncoming nurse) by Rolly SalterHaley (offgoing nurse). Report included the following information SBAR and ED Summary.

## 2017-09-16 NOTE — Other (Signed)
The patient remains voluntary and is open to bed placement anywhere in the state. The following psychiatric hospitals were contacted by this counselor in an effort to find an appropriate bed:    1) [facility] - [name] "No available beds"  2) [facility] - [name] "No available beds"  3) [facility] [name] "No available beds"  4) [facility] - [name] "No available beds"    5) [facility] - ([facility]) [name] "We're full"  [facility]) [facility] ([facility]) [name] "I can open up a case. Fax over the packet"  7) [facility] - ([facility]) [name] "We're full"  8) [facility] - ([facility]) [name] "We're full"  [facility]) [name] ([facility]) [name] "I can open up a case. Fax over the packet"  10) [facility] -([facility]) [name] "We're full"    11) [facility] - [name] "No acute beds"  12) [facility] - [name] "No male beds at this time. Call back around 10am"  13) [facility]/ [name] "On diversion"     Next [employer] counselor will continue bed search.     Clayton Mccullough, [facility]

## 2017-09-16 NOTE — ED Provider Notes (Signed)
HPI   47 year male with bipolar disorder presents to the ED by EMS after being found asleep in his car at a traffic light. Patient states he is from [facility] and is on a spiritual journey. He states he has been sleeping at rest stops. He has been eating. He has no physical complaints. He has not been taking his meds x 3 days because he is on this journey.  Denies street drugs, reports occasional acholol but not today and does smoke. Denies hallucinations.     No past medical history on file.    No past surgical history on file.      No family history on file.    Social History     Socioeconomic History   ??? Marital status: Not on file     Spouse name: Not on file   ??? Number of children: Not on file   ??? Years of education: Not on file   ??? Highest education level: Not on file   Social Needs   ??? Financial resource strain: Not on file   ??? Food insecurity - worry: Not on file   ??? Food insecurity - inability: Not on file   ??? Transportation needs - medical: Not on file   ??? Transportation needs - non-medical: Not on file   Occupational History   ??? Not on file   Tobacco Use   ??? Smoking status: Not on file   Substance and Sexual Activity   ??? Alcohol use: Not on file   ??? Drug use: Not on file   ??? Sexual activity: Not on file   Other Topics Concern   ??? Not on file   Social History Narrative   ??? Not on file         ALLERGIES: Penicillins    Review of Systems   Constitutional: Negative for fever.   HENT: Negative for congestion.    Eyes: Negative for visual disturbance.   Respiratory: Negative for shortness of breath.    Cardiovascular: Negative for chest pain.   Gastrointestinal: Negative for abdominal pain, nausea and vomiting.   Endocrine: Negative for polyuria.   Genitourinary: Negative for dysuria.   Musculoskeletal: Negative for gait problem.   Skin: Negative for rash.   Neurological: Negative for headaches.   Psychiatric/Behavioral: Positive for self-injury and sleep disturbance.  Negative for dysphoric mood and hallucinations. The patient is hyperactive.        Vitals:    09/16/17 0533   BP: 115/90   Pulse: (!) 101   Resp: 16   Temp: 98 ??F (36.7 ??C)   SpO2: 95%            Physical Exam   Constitutional: He is oriented to person, place, and time. He appears well-developed and well-nourished. No distress.   HENT:   Head: Normocephalic and atraumatic.   Mouth/Throat: No oropharyngeal exudate.   Eyes: Pupils are equal, round, and reactive to light. Right eye exhibits no discharge. Left eye exhibits no discharge. No scleral icterus.   Neck: Normal range of motion. Neck supple. No JVD present.   Cardiovascular: Normal rate, regular rhythm and normal heart sounds.   No murmur heard.  Pulmonary/Chest: Effort normal and breath sounds normal. No stridor. No respiratory distress. He has no wheezes. He has no rales. He exhibits no tenderness.   Abdominal: Soft. Bowel sounds are normal. He exhibits no distension and no mass. There is no tenderness. There is no rebound and no guarding.  Musculoskeletal: Normal range of motion.   Neurological: He is oriented to person, place, and time.   Skin: Skin is warm and dry. Capillary refill takes less than 2 seconds. No rash noted.   Psychiatric:   Hyperactive  Delayed responses to questions        MDM       Procedures         [facility] eval.    Feels patient needs admission. Labs drawn, patient medically cleared for admission.

## 2017-09-16 NOTE — ED Notes (Signed)
[facility] at bedside to evaluate patient

## 2017-09-16 NOTE — Other (Signed)
Patient remains voluntary and willing to go to any available hospital in the state.  This counselor has begun bed search (see previous note). Next [employer] counselor will continue bed search.

## 2023-10-31 ENCOUNTER — Emergency Department (HOSPITAL_COMMUNITY)
Admission: EM | Admit: 2023-10-31 | Discharge: 2023-10-31 | Disposition: A | Discharge status: Home / Self Care | Payer: MEDICAID

## 2023-10-31 ENCOUNTER — Encounter (HOSPITAL_COMMUNITY): Payer: Self-pay

## 2023-10-31 ENCOUNTER — Other Ambulatory Visit: Payer: Self-pay

## 2023-10-31 DIAGNOSIS — N4889 Other specified disorders of penis: Secondary | ICD-10-CM | POA: Diagnosis present

## 2023-10-31 LAB — URINALYSIS, ROUTINE W REFLEX MICROSCOPIC
Bacteria, UA: NONE SEEN
Bilirubin Urine: NEGATIVE
Glucose, UA: >=500 mg/dL — AB
Hgb urine dipstick: NEGATIVE
Ketones, ur: NEGATIVE mg/dL
Leukocytes,Ua: NEGATIVE
Nitrite: NEGATIVE
Protein, ur: NEGATIVE mg/dL
Specific Gravity, Urine: 1.035 — ABNORMAL HIGH (ref 1.005–1.030)
pH: 6 (ref 5.0–8.0)

## 2023-10-31 MED ORDER — NAPROXEN 500 MG PO TABS: 500.0000 mg | ORAL_TABLET | Freq: Two times a day (BID) | ORAL | 0 refills | Status: AC

## 2023-10-31 NOTE — ED Triage Notes (Signed)
 Pt c/o penile painx3wks. Pt denies penile discharge. Pt states he masterbated hard 3 wks ago to go to sleep and penis has been hurting since.

## 2023-10-31 NOTE — ED Provider Notes (Signed)
 [place] EMERGENCY DEPARTMENT AT [facility] Provider Note   CSN: [id number] Arrival date & time: 10/31/23  1131     History  No chief complaint on file.   Jerry Frye is a 53 y.o. male.  53 year old male with past medical history of schizophrenia presenting to the emergency department today with penile pain.  Patient states that he masturbated 3 weeks ago and since then he has been having pain in the shaft of his penis.  He denies any testicular pain or pain over his epididymis.  Denies any new sexual partners recently.  He states that he has had some mild dysuria from time to time since then.  He has been able to urinate without difficulty.  He came to the emergency department today for further evaluation regarding this.        Home Medications Prior to Admission medications   Medication Sig Start Date End Date Taking? Authorizing Provider  naproxen (NAPROSYN) 500 MG tablet Take 1 tablet (500 mg total) by mouth 2 (two) times daily. 10/31/23  Yes Durwin Glaze, MD  benztropine (COGENTIN) 1 MG tablet Take 1 tablet (1 mg total) by mouth 2 (two) times daily. 04/07/16   Adonis Brook, NP  divalproex (DEPAKOTE) 500 MG DR tablet Take 1 tablet (500 mg total) by mouth 2 (two) times daily after a meal. 04/07/16   Adonis Brook, NP  traZODone (DESYREL) 100 MG tablet Take 1 tablet (100 mg total) by mouth at bedtime. 04/07/16   Adonis Brook, NP  ziprasidone (GEODON) 60 MG capsule Take 1 capsule (60 mg total) by mouth 2 (two) times daily with a meal. 04/07/16   Adonis Brook, NP      Allergies    Ciprofloxacin and Penicillins    Review of Systems   Review of Systems  Genitourinary:  Positive for penile pain.  All other systems reviewed and are negative.   Physical Exam Updated Vital Signs BP 120/84 (BP Location: Right Arm)   Pulse (!) 105   Temp 97.7 F (36.5 C)   Resp 18   Ht 5\' 10"  (1.778 m)   Wt 96.2 kg   SpO2 95%   BMI 30.43 kg/m  Physical  Exam Vitals and nursing note reviewed.   Gen: NAD GU: Normal-appearing male genitalia with no epididymal tenderness or testicular tenderness or swelling noted.  No bruising or discoloration noted.  ED Results / Procedures / Treatments   Labs (all labs ordered are listed, but only abnormal results are displayed) Labs Reviewed  URINALYSIS, ROUTINE W REFLEX MICROSCOPIC - Abnormal; Notable for the following components:      Result Value   Specific Gravity, Urine 1.035 (*)    Glucose, UA >=500 (*)    All other components within normal limits    EKG None  Radiology No results found.  Procedures Procedures    Medications Ordered in ED Medications - No data to display  ED Course/ Medical Decision Making/ A&P                                 Medical Decision Making 53 year old male with past medical history of schizophrenia presenting to the emergency department today with penile pain after masturbating 3 weeks ago.  His exam here is reassuring.  He does not have any findings to suggest testicular pathology at this time.  He does not have exam findings consistent with penile fracture.  I will  obtain a urinalysis here.  Will have the patient follow-up with urology as an outpatient.  I will give him NSAIDs.  The patient's UA is negative.  He is discharged.  Amount and/or Complexity of Data Reviewed Labs: ordered.  Risk Prescription drug management.           Final Clinical Impression(s) / ED Diagnoses Final diagnoses:  Penile pain    Rx / DC Orders ED Discharge Orders          Ordered    naproxen (NAPROSYN) 500 MG tablet  2 times daily        10/31/23 2208              Carin Charleston, MD 10/31/23 2210

## 2023-10-31 NOTE — Discharge Instructions (Signed)
 Your exam today was unremarkable.  Your urinalysis does not show any infection.  Please take the naproxen and follow-up with the urologist at the number provided.  Call tomorrow to schedule a follow-up appointment.  Return to the ER for worsening symptoms or if you have the inability to urinate.
